# Patient Record
Sex: Male | Born: 1990 | Race: White | Hispanic: No | Marital: Single | Smoking: Never smoker
Health system: Southern US, Community
[De-identification: ages and names within clinical notes are randomized; demographics above are authoritative.]

## PROBLEM LIST (undated history)

## (undated) DIAGNOSIS — K5090 Crohn's disease, unspecified, without complications: Secondary | ICD-10-CM

## (undated) DIAGNOSIS — M8580 Other specified disorders of bone density and structure, unspecified site: Secondary | ICD-10-CM

## (undated) DIAGNOSIS — A0472 Enterocolitis due to Clostridium difficile, not specified as recurrent: Secondary | ICD-10-CM

## (undated) DIAGNOSIS — R569 Unspecified convulsions: Secondary | ICD-10-CM

## (undated) DIAGNOSIS — E559 Vitamin D deficiency, unspecified: Secondary | ICD-10-CM

## (undated) DIAGNOSIS — G809 Cerebral palsy, unspecified: Secondary | ICD-10-CM

## (undated) HISTORY — DX: Cerebral palsy, unspecified: G80.9

## (undated) HISTORY — DX: Crohn's disease, unspecified, without complications: K50.90

## (undated) HISTORY — DX: Enterocolitis due to Clostridium difficile, not specified as recurrent: A04.72

---
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
## 2009-02-18 ENCOUNTER — Ambulatory Visit: Payer: Self-pay | Admitting: Pediatrics

## 2009-06-19 ENCOUNTER — Ambulatory Visit: Payer: Self-pay | Admitting: Pediatrics

## 2009-10-21 ENCOUNTER — Ambulatory Visit: Payer: Self-pay | Admitting: Pediatrics

## 2010-02-04 ENCOUNTER — Ambulatory Visit: Payer: Self-pay | Admitting: Pediatrics

## 2010-06-10 ENCOUNTER — Ambulatory Visit (INDEPENDENT_AMBULATORY_CARE_PROVIDER_SITE_OTHER): Payer: Medicaid Other | Admitting: Pediatrics

## 2010-06-10 DIAGNOSIS — K5080 Crohn's disease of both small and large intestine without complications: Secondary | ICD-10-CM

## 2010-07-22 NOTE — Op Note (Signed)
NAMELEEMON, AYALA NO.:  [id number]   MEDICAL RECORD NO.:  [id number]          PATIENT TYPE:  AMB   LOCATION:  SDS                          FACILITY:  [facility]   PHYSICIAN:  Oletha Blend, M.D.  DATE OF BIRTH:  [birth year]   DATE OF PROCEDURE:  08/20/2006  DATE OF DISCHARGE:  08/20/2006                               OPERATIVE REPORT   PREOPERATIVE DIAGNOSIS:  Diarrhea and weight loss.   POSTOPERATIVE DIAGNOSIS:  Crohn's colitis.   NAME OF OPERATION:  Colonoscopy with biopsy.   SURGEON:  Oletha Blend, M.D.   ASSISTANTS:  None.   DESCRIPTION OF FINDINGS:  Following informed written consent, the  patient was taken to the operating room and placed under general  anesthesia, with continuous cardiopulmonary monitoring.  He remained in  the supine position, and the Pentax pediatric colonoscope was inserted  per rectum and advanced without difficulty.  There was no evidence for  perianal tags or fissures.  Digital examination of the rectum revealed  an empty rectal vault.  The colonoscope was advanced 140 cm to the  cecum.  The entire colon had diffuse serpiginous ulcers with  pseudopolyps.  Multiple biopsies were obtained in the cecum, hepatic  flexure, and sigmoid colon, which confirmed chronic colitis, most likely  Crohn's disease.  The colonoscope was gradually withdrawn, and the  patient was awakened and taken to the recovery room in satisfactory  condition.  He will be placed on prednisone therapy and released later  today to the care of his family.  A 18-monthreturn appointment will be  established by phone to monitor his progress.   DESCRIPTION OF TECHNICAL PROCEDURES USED:  Pentax pediatric colonoscope,  with cold biopsy forceps.   DESCRIPTION OF SPECIMENS REMOVED:  Cecum x3 in formalin, hepatic flexure  x3 in formalin, sigmoid colon x3 in formalin.           ______________________________  JOletha Blend M.D.     JHC/MEDQ  D:  08/26/2006   T:  08/27/2006  Job:  [id number]  cc:   KGregary Signs M.D.

## 2010-10-16 ENCOUNTER — Encounter: Payer: Self-pay | Admitting: Gastroenterology

## 2010-10-16 ENCOUNTER — Ambulatory Visit: Payer: Medicaid Other | Admitting: Gastroenterology

## 2010-10-16 ENCOUNTER — Ambulatory Visit (INDEPENDENT_AMBULATORY_CARE_PROVIDER_SITE_OTHER): Payer: Medicaid Other | Admitting: Gastroenterology

## 2010-10-16 VITALS — BP 114/75 | HR 75 | Temp 98.3°F | Ht 65.0 in | Wt 110.0 lb

## 2010-10-16 DIAGNOSIS — K5090 Crohn's disease, unspecified, without complications: Secondary | ICD-10-CM

## 2010-10-16 DIAGNOSIS — K5010 Crohn's disease of large intestine without complications: Secondary | ICD-10-CM | POA: Insufficient documentation

## 2010-10-16 MED ORDER — MERCAPTOPURINE 50 MG PO TABS: 50.0000 mg | ORAL_TABLET | Freq: Every day | ORAL | Status: DC

## 2010-10-16 NOTE — Assessment & Plan Note (Addendum)
Sx well controlled on 6-MP.  OPV IN 6 MOS. Will repeat CBC and HFP. Consider repeat Dexa in 2013. MAY LIBERALIZE DIET. AVOID NSAIDS AND NICOTINE.

## 2010-10-16 NOTE — Progress Notes (Signed)
Reminder in epic to follow up in 6 months °

## 2010-10-16 NOTE — Progress Notes (Signed)
Cc to PCP 

## 2010-10-16 NOTE — Progress Notes (Signed)
  Subjective:    Patient ID: Tanner Bradley, male    DOB: 1990-07-01, 20 y.o.   MRN: [id number]  HPI Last flare 2008. Sx controlled on 6-MP 50 mg daily. Bms: every day. No night stools. No bleeding, or abd pain. DIDN'T HAVE PAIN INTIALLY, just had diarrhea and anorexia. No joint or back pain, or sores in his mouth. Had PNA inJune.  Past Medical History  Diagnosis Date  . Crohn's disease 2008 TCS POS SBFT NL    Rx: Prednisone then 6-MP    Past Surgical History  Procedure Date  . Colonoscopy 2008    COLITIS, bX: POS GRANULOMA-->CD   Allergies  Allergen Reactions  . Amoxicillin Rash   Current Outpatient Prescriptions  Medication Sig Dispense Refill  . Calcium Carbonate-Vit D-Min (CALCIUM 600+D PLUS MINERALS) 600-400 MG-UNIT CHEW Chew by mouth.        . mercaptopurine (PURINETHOL) 50 MG tablet Take 1 tablet (50 mg total) by mouth daily. Give on an empty stomach 1 hour before or 2 hours after meals. Caution: Chemotherapy.  30 tablet  5    History   Social History  . Marital Status: Single   Social History Main Topics  . Smoking status: Never Smoker   .    Marland Kitchen Alcohol Use: No       Review of Systems  All other systems reviewed and are negative.       Objective:   Physical Exam  Vitals reviewed. Constitutional: He is oriented to person, place, and time. He appears well-developed.       THIN WHITE MALE  HENT:  Head: Normocephalic and atraumatic.  Mouth/Throat: No oropharyngeal exudate.       NO LESIONS  Eyes: Pupils are equal, round, and reactive to light. No scleral icterus.  Neck: Normal range of motion. Neck supple.  Cardiovascular: Normal rate, regular rhythm and normal heart sounds.   Pulmonary/Chest: Breath sounds normal.  Abdominal: Soft. Bowel sounds are normal. He exhibits no distension. There is no tenderness.  Lymphadenopathy:    He has no cervical adenopathy.  Neurological: He is alert and oriented to person, place, and time.       NO NEW FOCAL  DEFICITS  Psychiatric: He has a normal mood and affect.          Assessment & Plan:

## 2010-12-25 LAB — CBC
HCT: 36.7
Hemoglobin: 11.6 — ABNORMAL LOW
MCHC: 31.7
MCV: 77.1 — ABNORMAL LOW
Platelets: 803 — ABNORMAL HIGH
RBC: 4.76
RDW: 14.7 — ABNORMAL HIGH
WBC: 9

## 2011-02-09 ENCOUNTER — Telehealth: Payer: Self-pay | Admitting: Gastroenterology

## 2011-02-09 NOTE — Telephone Encounter (Signed)
Informed Mom.

## 2011-02-09 NOTE — Telephone Encounter (Signed)
Called and spoke to pt's mom, Smitty Knudsen. She said she just had pt's prescription of Mercaptopurine 21m once a day filled. This is first Rx from Dr. FOneida Alar and she had said to take on an empty stomach. She would like to know if pt can continue to take it with breakfast. Pediatric GI physician had let him do it, when he had some problems taking on an empty stomach. She just wants to made sure she gives as it should be done. Please advise!

## 2011-02-09 NOTE — Telephone Encounter (Signed)
Mother of patient has questions about a prescription. Please return her call at [phone] (patient is mentally impaired and she is his POA)

## 2011-02-09 NOTE — Telephone Encounter (Signed)
Call pt's mom. He may take 6MP with his breakfast.

## 2011-04-13 ENCOUNTER — Telehealth: Payer: Self-pay | Admitting: Gastroenterology

## 2011-04-13 NOTE — Telephone Encounter (Signed)
Spoke with mom. Pt having wisdom teeth extraction. Instructed pt's mom to avoid long term NSAIDs.

## 2011-04-13 NOTE — Telephone Encounter (Signed)
Please call patient's mother regarding questions about patient receiving anesthesia. She has concerns with his crohn's disease.

## 2011-04-29 ENCOUNTER — Ambulatory Visit: Payer: Medicaid Other | Admitting: Gastroenterology

## 2011-05-20 ENCOUNTER — Ambulatory Visit (INDEPENDENT_AMBULATORY_CARE_PROVIDER_SITE_OTHER): Payer: Medicaid Other | Admitting: Gastroenterology

## 2011-05-20 ENCOUNTER — Encounter: Payer: Self-pay | Admitting: Gastroenterology

## 2011-05-20 VITALS — BP 120/73 | HR 111 | Temp 97.6°F | Ht 62.0 in | Wt 100.0 lb

## 2011-05-20 DIAGNOSIS — T380X5A Adverse effect of glucocorticoids and synthetic analogues, initial encounter: Secondary | ICD-10-CM

## 2011-05-20 DIAGNOSIS — K5010 Crohn's disease of large intestine without complications: Secondary | ICD-10-CM

## 2011-05-20 DIAGNOSIS — M8580 Other specified disorders of bone density and structure, unspecified site: Secondary | ICD-10-CM

## 2011-05-20 DIAGNOSIS — M899 Disorder of bone, unspecified: Secondary | ICD-10-CM

## 2011-05-20 DIAGNOSIS — K5090 Crohn's disease, unspecified, without complications: Secondary | ICD-10-CM

## 2011-05-20 LAB — CBC WITH DIFFERENTIAL/PLATELET
Basophils Absolute: 0 10*3/uL (ref 0.0–0.1)
Basophils Relative: 1 % (ref 0–1)
Eosinophils Absolute: 0 10*3/uL (ref 0.0–0.7)
Eosinophils Relative: 1 % (ref 0–5)
HCT: 42.7 % (ref 39.0–52.0)
Hemoglobin: 14.2 g/dL (ref 13.0–17.0)
Lymphocytes Relative: 25 % (ref 12–46)
Lymphs Abs: 0.7 10*3/uL (ref 0.7–4.0)
MCH: 34.2 pg — ABNORMAL HIGH (ref 26.0–34.0)
MCHC: 33.3 g/dL (ref 30.0–36.0)
MCV: 102.9 fL — ABNORMAL HIGH (ref 78.0–100.0)
Monocytes Absolute: 0.3 10*3/uL (ref 0.1–1.0)
Monocytes Relative: 11 % (ref 3–12)
Neutro Abs: 1.8 10*3/uL (ref 1.7–7.7)
Neutrophils Relative %: 62 % (ref 43–77)
Platelets: 295 10*3/uL (ref 150–400)
RBC: 4.15 MIL/uL — ABNORMAL LOW (ref 4.22–5.81)
RDW: 13.6 % (ref 11.5–15.5)
WBC: 2.9 10*3/uL — ABNORMAL LOW (ref 4.0–10.5)

## 2011-05-20 LAB — COMPREHENSIVE METABOLIC PANEL
ALT: 15 U/L (ref 0–53)
AST: 16 U/L (ref 0–37)
Albumin: 5.1 g/dL (ref 3.5–5.2)
Alkaline Phosphatase: 44 U/L (ref 39–117)
BUN: 13 mg/dL (ref 6–23)
CO2: 28 mEq/L (ref 19–32)
Calcium: 9.9 mg/dL (ref 8.4–10.5)
Chloride: 103 mEq/L (ref 96–112)
Creat: 0.87 mg/dL (ref 0.50–1.35)
Glucose, Bld: 90 mg/dL (ref 70–99)
Potassium: 4.2 mEq/L (ref 3.5–5.3)
Sodium: 142 mEq/L (ref 135–145)
Total Bilirubin: 0.8 mg/dL (ref 0.3–1.2)
Total Protein: 7.7 g/dL (ref 6.0–8.3)

## 2011-05-20 MED ORDER — MERCAPTOPURINE 50 MG PO TABS: ORAL_TABLET | ORAL | Status: DC

## 2011-05-20 NOTE — Patient Instructions (Signed)
DRINK CARNATION INSTANT BREAKFAST TWICE DAILY.  CONTINUE 6-MP.  ADD HIGH CALORIE FOODS. BRUSH YOUR TEETH AFTERWARDS.  CHECK LABS TODAY. FOLLOW UP IN 6 MOS.

## 2011-05-20 NOTE — Progress Notes (Signed)
Faxed to PCP

## 2011-05-20 NOTE — Progress Notes (Signed)
  Subjective:    Patient ID: Tanner Bradley, male    DOB: 10-07-1990, 21 y.o.   MRN: [id number]  PCP: [facility]  HPI Has been having trouble with his teeth. Dentist recommended pt stop eating sweets. Diet has changed significantly. Pt lost 10 lbs since last visit. No NAUSEA, VOMITING, DIARRHEA, SORES IN HIS MOUTH, RASH ON LEGS, OR JOINT/BACK PAIN.  Past Medical History  Diagnosis Date  . Crohn's disease 2008 TCS POS SBFT NL    Rx: Prednisone then 6-MP  . Cerebral palsy     Past Surgical History  Procedure Date  . 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 Amoxicillin Rash    Current Outpatient Prescriptions  Medication Sig Dispense Refill  . mercaptopurine (PURINETHOL) 50 MG tablet 1 po daily-Give on an empty stomach 1 hour before or 2 hours after meals.    . Calcium Carbonate-Vit D-Min (CALCIUM 600+D PLUS MINERALS) 600-400 MG-UNIT CHEW Chew by mouth.            Review of Systems     Objective:   Physical Exam  Vitals reviewed. Constitutional: He is oriented to person, place, and time. No distress.  HENT:  Head: Normocephalic and atraumatic.  Mouth/Throat: Oropharynx is clear and moist. No oropharyngeal exudate.  Eyes: Pupils are equal, round, and reactive to light. No scleral icterus.  Neck: Normal range of motion. Neck supple.  Cardiovascular: Normal rate, regular rhythm and normal heart sounds.   Pulmonary/Chest: Effort normal and breath sounds normal. No respiratory distress.  Abdominal: Soft. Bowel sounds are normal. He exhibits no distension. There is no tenderness.  Musculoskeletal: Normal range of motion. He exhibits no edema.  Lymphadenopathy:    He has no cervical adenopathy.  Neurological: He is alert and oriented to person, place, and time.       NO FOCAL DEFICITS   Psychiatric: He has a normal mood and affect.          Assessment & Plan:

## 2011-05-20 NOTE — Assessment & Plan Note (Signed)
NEEDS REPEAT DEXA SCAN & ENDOCRINOLOGY REFERRAL.

## 2011-05-20 NOTE — Assessment & Plan Note (Addendum)
Sx controlled. Weight down associted with diet change due to dental issues.  Add high calorie foods. BRUSH TEETH AFTERWARDS. CIB BID Cmp/cbc/diff qyr OPV in 6 mos. NEEDS DEXA SCAN & REFERRAL TO ENDOCRINOLOGY-2010 LOW BMD.

## 2011-05-21 ENCOUNTER — Telehealth: Payer: Self-pay | Admitting: Gastroenterology

## 2011-05-21 NOTE — Telephone Encounter (Signed)
Tanner Bradley , can you review pt's labs in Dr. Nona Dell absence?  ( pt weighed 100 lbs  Yesterday).

## 2011-05-21 NOTE — Telephone Encounter (Signed)
Informed mom of weight. She is aware [name] will review labs early next week and make recommendations.

## 2011-05-21 NOTE — Progress Notes (Signed)
Reminder in epic to follow up in 6 months in E30 with [facility]

## 2011-05-21 NOTE — Telephone Encounter (Signed)
You can give her his weight from [place] let her know he was down 10# since last office visit.   Dr Oneida Alar will review his labs next week & make further recommendations.

## 2011-05-21 NOTE — Telephone Encounter (Signed)
Mother is calling about his weight, shes asking what his weight was yesterday & also wants lab results from yesterday please advise??

## 2011-05-26 ENCOUNTER — Telehealth: Payer: Self-pay | Admitting: Gastroenterology

## 2011-05-26 NOTE — Telephone Encounter (Signed)
CALLED CELL PHONE-WRONG #. [place] PHONE, RINGS, SOMEONE PICKS UP, BUT HANGS UP.  PT HAS NL CMP, MILD LEUKOPENIA (ANC 1800), AND MCV 102.9. NEEDS B12 LEVEL.  SEND LETTER FOR PT TO CONTACT OFFICE FOR RESULTS AND UPDATE PHONE NUMBERS.

## 2011-05-26 NOTE — Telephone Encounter (Signed)
CALLED PT'S HOME PHONE. NO VM.

## 2011-05-26 NOTE — Telephone Encounter (Addendum)
CALLED PT'S HOME PHONE. NO VM.

## 2011-05-27 ENCOUNTER — Other Ambulatory Visit: Payer: Self-pay

## 2011-05-27 ENCOUNTER — Other Ambulatory Visit: Payer: Self-pay | Admitting: Gastroenterology

## 2011-05-27 DIAGNOSIS — D72819 Decreased white blood cell count, unspecified: Secondary | ICD-10-CM

## 2011-05-27 NOTE — Telephone Encounter (Signed)
Called and informed pt's Mom. Lab order faxed to [facility].

## 2011-05-28 ENCOUNTER — Telehealth: Payer: Self-pay | Admitting: Gastroenterology

## 2011-05-28 LAB — VITAMIN B12: Vitamin B-12: 655 pg/mL (ref 211–911)

## 2011-05-28 NOTE — Telephone Encounter (Signed)
Please call pt's mother (POA). She is anxious to know the lab results.

## 2011-05-28 NOTE — Telephone Encounter (Signed)
SPOKE WITH MOM-HOME PHONE. DOESN'T WANT HIM TO LOSE MORE WEIGHT. HE'S RIGHT AT 100 LBS. PLAN TO REMOVE 3 WISDOM TEETH MAY 22. LOVE CIB. DISCUSSED LAB RESULTS. PT NEEDS FOLIC ACID DAILY. EATING MUCH MORE. DISCUSSED WITH MOM IF PT LOSES TO 95 LBS, MAY NEED A FEEDING TUBE.

## 2011-05-28 NOTE — Telephone Encounter (Signed)
Please call her. He should take 1 mg daily.

## 2011-05-28 NOTE — Telephone Encounter (Signed)
Informed mom.

## 2011-05-28 NOTE — Telephone Encounter (Signed)
Pt's mom called and wanted to know how much folic acid you wanted him to take daily

## 2011-09-16 ENCOUNTER — Ambulatory Visit (INDEPENDENT_AMBULATORY_CARE_PROVIDER_SITE_OTHER): Payer: Medicaid Other | Admitting: Gastroenterology

## 2011-09-16 ENCOUNTER — Encounter: Payer: Self-pay | Admitting: Gastroenterology

## 2011-09-16 VITALS — BP 128/80 | HR 124 | Temp 97.8°F | Ht 62.0 in | Wt 110.6 lb

## 2011-09-16 DIAGNOSIS — M8580 Other specified disorders of bone density and structure, unspecified site: Secondary | ICD-10-CM

## 2011-09-16 DIAGNOSIS — K5010 Crohn's disease of large intestine without complications: Secondary | ICD-10-CM

## 2011-09-16 DIAGNOSIS — M899 Disorder of bone, unspecified: Secondary | ICD-10-CM

## 2011-09-16 DIAGNOSIS — T380X5A Adverse effect of glucocorticoids and synthetic analogues, initial encounter: Secondary | ICD-10-CM

## 2011-09-16 MED ORDER — B-6 FOLIC ACID 400-1000-50 MCG-MCG-MG PO CAPS: ORAL_CAPSULE | ORAL | Status: DC

## 2011-09-16 NOTE — Progress Notes (Signed)
  Subjective:    Patient ID: Tanner Bradley, male    DOB: [birth year], 22 y.o.   MRN: [id number]  PCP: [facility]  HPI Gained weight since last visit. BMs fine. Eating better. Teeth fixed. 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 BUILDS ROBOTS AND COMPUTERS.   Past Medical History  Diagnosis Date  . Crohn's disease 2008 TCS POS SBFT NL    Rx: Prednisone then 6-MP  . Cerebral palsy     Past Surgical History  Procedure Date  . 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 Amoxicillin Rash    Current Outpatient Prescriptions  Medication Sig Dispense Refill  . calcium citrate (CALCITRATE - DOSED IN MG ELEMENTAL CALCIUM) 950 MG tablet Take 2 tablets by mouth daily.      . mercaptopurine (PURINETHOL) 50 MG tablet 1 po daily-Give on an empty stomach 1 hour before or 2 hours after meals.    Marland Kitchen FOLIC ACID 694/854 6270 MCG DAILY        Review of Systems     Objective:   Physical Exam  Vitals reviewed. Constitutional: He is oriented to person, place, and time. No distress.  HENT:  Head: Normocephalic and atraumatic.  Mouth/Throat: Oropharynx is clear and moist.  Eyes: Conjunctivae are normal. Pupils are equal, round, and reactive to light. No scleral icterus.  Neck: Normal range of motion. Neck supple.  Cardiovascular: Normal rate and regular rhythm.   Pulmonary/Chest: Effort normal and breath sounds normal. No respiratory distress.  Abdominal: Soft. Bowel sounds are normal. He exhibits no distension. There is no tenderness.  Musculoskeletal: He exhibits no edema.  Lymphadenopathy:    He has no cervical adenopathy.  Neurological: He is alert and oriented to person, place, and time.  Psychiatric: He has a normal mood and affect.          Assessment & Plan:

## 2011-09-16 NOTE — Assessment & Plan Note (Signed)
LAST DEXA 2010 AND TAKES CALCIUM. WEIGHT UP 10 LBS, BML > 20. OFF STEROIDS FOR > 1 YEAR.  DEXA SCAN AT [place]. SEE ENDOCRINOLOGY IF OSTEOPENIA PERSISTS

## 2011-09-16 NOTE — Patient Instructions (Signed)
COMPLETE THE DEXA SCAN AT [facility].  CONTINUE DIET PLAN.  YOUR PRESCRIPTION FOR FOLIC ACID WAS SENT TO [facility].  FOLLOW UP IN 6 MOS. WE WILL NEED TO RECHECK LABS AFTER YOUR NEXT VISIT.

## 2011-09-16 NOTE — Progress Notes (Signed)
Forwarded to [facility]

## 2011-09-16 NOTE — Assessment & Plan Note (Signed)
CONTINUE 6-MP.  OPV IN 6 MOS-RECHECK LABS.

## 2011-09-17 NOTE — Progress Notes (Signed)
Reminder in epic to follow up in 3 months with [facility] ONLY in E30

## 2011-09-21 NOTE — Progress Notes (Signed)
Faxed to PCP

## 2011-09-28 ENCOUNTER — Telehealth: Payer: Self-pay | Admitting: Gastroenterology

## 2011-09-28 NOTE — Telephone Encounter (Signed)
Mother is calling to get the bone density results if they are back. Please call Tanner Bradley (mother) at [phone]

## 2011-09-28 NOTE — Telephone Encounter (Signed)
[facility] to see if the results where back and they are not.

## 2011-09-30 NOTE — Telephone Encounter (Signed)
I received the bone density results from [facility] and gave to SLF. Pt's mother called back wanting to know if I had gotten them. I told her that I did and gave them to SLF . I also told her that as soon I SLF gave [place] the results that we would call her. She is just very worried about her son.

## 2011-09-30 NOTE — Telephone Encounter (Signed)
I called [facility] and they said that the test is done but they do not a hard copy to fax to me yet, but as soon as they get it they will fax it to me. Pt's mother is aware that we are waiting on them to fax it to [place].

## 2011-10-01 ENCOUNTER — Telehealth: Payer: Self-pay | Admitting: Gastroenterology

## 2011-10-01 NOTE — Telephone Encounter (Signed)
Pt's mother called this morning want to know if we had the results. I paged SLF to see if she could get me the results for them.

## 2011-10-01 NOTE — Telephone Encounter (Signed)
SLF said that she will call Pt's mother when she get a chance. I am going to call Pt's mother to make her aware.

## 2011-10-01 NOTE — Telephone Encounter (Signed)
PLEASE CALL PT.  HE STILL HAS OSTEOPENIA. HE SHOULD SEE ENDOCRINOLOGY (DR. NIDA).

## 2011-10-02 NOTE — Telephone Encounter (Signed)
Pt's mother is aware of results.

## 2011-10-02 NOTE — Telephone Encounter (Signed)
Referral has been faxed waiting to get appointment date an time

## 2011-10-06 NOTE — Telephone Encounter (Signed)
Patient is scheduled to see Dr. Dorris Fetch on August 1st at 10 am and patient is aware

## 2011-10-07 NOTE — Telephone Encounter (Signed)
REVIEWED.  WILL FAX REPORT FROM [facility] TODAY.

## 2012-01-08 DIAGNOSIS — A0472 Enterocolitis due to Clostridium difficile, not specified as recurrent: Secondary | ICD-10-CM

## 2012-01-08 HISTORY — DX: Enterocolitis due to Clostridium difficile, not specified as recurrent: A04.72

## 2012-01-14 ENCOUNTER — Other Ambulatory Visit: Payer: Self-pay | Admitting: Gastroenterology

## 2012-01-14 ENCOUNTER — Ambulatory Visit (INDEPENDENT_AMBULATORY_CARE_PROVIDER_SITE_OTHER): Payer: Medicaid Other | Admitting: Gastroenterology

## 2012-01-14 ENCOUNTER — Telehealth: Payer: Self-pay

## 2012-01-14 ENCOUNTER — Encounter: Payer: Self-pay | Admitting: Gastroenterology

## 2012-01-14 VITALS — BP 125/79 | HR 156 | Temp 98.5°F | Ht 62.0 in | Wt 112.6 lb

## 2012-01-14 DIAGNOSIS — R197 Diarrhea, unspecified: Secondary | ICD-10-CM

## 2012-01-14 DIAGNOSIS — K5010 Crohn's disease of large intestine without complications: Secondary | ICD-10-CM

## 2012-01-14 LAB — CBC WITH DIFFERENTIAL/PLATELET
Basophils Absolute: 0.1 10*3/uL (ref 0.0–0.1)
Basophils Relative: 1 % (ref 0–1)
Eosinophils Absolute: 0 10*3/uL (ref 0.0–0.7)
Eosinophils Relative: 0 % (ref 0–5)
HCT: 40.1 % (ref 39.0–52.0)
Hemoglobin: 13.8 g/dL (ref 13.0–17.0)
Lymphocytes Relative: 14 % (ref 12–46)
Lymphs Abs: 1 10*3/uL (ref 0.7–4.0)
MCH: 33.2 pg (ref 26.0–34.0)
MCHC: 34.4 g/dL (ref 30.0–36.0)
MCV: 96.4 fL (ref 78.0–100.0)
Monocytes Absolute: 1 10*3/uL (ref 0.1–1.0)
Monocytes Relative: 15 % — ABNORMAL HIGH (ref 3–12)
Neutro Abs: 4.8 10*3/uL (ref 1.7–7.7)
Neutrophils Relative %: 70 % (ref 43–77)
Platelets: 447 10*3/uL — ABNORMAL HIGH (ref 150–400)
RBC: 4.16 MIL/uL — ABNORMAL LOW (ref 4.22–5.81)
RDW: 18.2 % — ABNORMAL HIGH (ref 11.5–15.5)
WBC: 6.8 10*3/uL (ref 4.0–10.5)

## 2012-01-14 NOTE — Telephone Encounter (Signed)
SLF asked for me to call them and see if they could come into the office today at 2:00. They are coming today.

## 2012-01-14 NOTE — Telephone Encounter (Signed)
OPV TODAY

## 2012-01-14 NOTE — Progress Notes (Signed)
Faxed to PCP

## 2012-01-14 NOTE — Patient Instructions (Signed)
GET BLOOD DRAWN. I WILL CHECK YOUR BLOOD COUNT AND TO MAKE SURE YOUR MERCAPTOPURINE DOSE IS THE RIGHT DOSE.  COLLECT STOOL SAMPLE.  I WILL CALL YOU WITH THE RESULTS.  YOU MAY USE KAOPECTATE AS NEEDED FOR LOOSE STOOLS.  FOLLOW UP JAN 2014.

## 2012-01-14 NOTE — Telephone Encounter (Signed)
Pt's mother called and said the Pt had some blood in his stool this morning. She thinks he is having a flare up. She would like for you to please call her at  [phone].

## 2012-01-14 NOTE — Assessment & Plan Note (Addendum)
ANOREXIA/DIARRHEA/BRBPR C/W WITH SX MILDLY ACTIVE DISEASE. RECENT ABX AND SICK FAMILY MEMBERS. DIFFERENTIAL DIAGNOSIS INCLUDE INFECTIOUS COLITIS.  NEEDS STOOL FOR ROUTINE Cx AND CDIFF. IF NO INFECTION, LOW DOSE STEROID TAPER. CBC-TP METABOLITIES TODAY. OPV IN JAN 2014.

## 2012-01-14 NOTE — Addendum Note (Signed)
Addended by: Danie Binder on: 01/14/2012 02:31 PM   Modules accepted: Orders

## 2012-01-14 NOTE — Progress Notes (Signed)
  Subjective:    Patient ID: Tanner Bradley, male    DOB: [birth year], 21 y.o.   MRN: [id number]  PCP: [facility]  HPI HAD COLDS 2 WEEKS AGO. GOT ABX. NOW HAS WATERY STOOLS AND IT TURNS HARD. SX FOR 2-3 DAYS. SAW SML AMOUNT OF BLOOD. DECREASED ABX ABOUT A MO AGO. LOSS OF APPETITE FOR 2 WEEKS. LAST TCS 2008. NO NAUSEA OR VOMITING. 2 STOOLS YESTERDAY: I AM HARD, 2ND TIMES IT WATERY. TODAY: SAW BLOOD AND LOOSE (LUMPS OF POOPY). MAY HAVE LOST 2 LBS IN PAST WEEK.  Past Medical History  Diagnosis Date  . Crohn's disease 2008 TCS POS SBFT NL    Rx: Prednisone then 6-MP  . Cerebral palsy    Past Surgical History  Procedure Date  . 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 Amoxicillin Rash    Current Outpatient Prescriptions  Medication Sig Dispense Refill  . calcium citrate (CALCITRATE - DOSED IN MG ELEMENTAL CALCIUM) 950 MG tablet Take 2 tablets by mouth daily.      . Cobalamine Combinations (B-6 FOLIC ACID) 258-5277-82 MCG-MCG-MG CAPS 1 PO DAILY    . mercaptopurine (PURINETHOL) 50 MG tablet 1 po daily-Give on an empty stomach 1 hour before or 2 hours after meals.        Review of Systems     Objective:   Physical Exam  Vitals reviewed. Constitutional: He is oriented to person, place, and time. No distress.  HENT:  Head: Normocephalic and atraumatic.  Mouth/Throat: Oropharynx is clear and moist. No oropharyngeal exudate.  Eyes: Pupils are equal, round, and reactive to light. No scleral icterus.  Neck: Normal range of motion. Neck supple.  Cardiovascular: Normal rate, regular rhythm and normal heart sounds.   Pulmonary/Chest: Effort normal and breath sounds normal. No respiratory distress.  Abdominal: Soft. Bowel sounds are normal. He exhibits no distension. There is no tenderness.  Musculoskeletal: He exhibits no edema.  Lymphadenopathy:    He has no cervical adenopathy.  Neurological: He is alert and oriented to person, place, and  time.       NO  NEW FOCAL DEFICITS   Psychiatric:       SLIGHTLY ANXIOUS MOOD, NL AFFECT           Assessment & Plan:

## 2012-01-15 ENCOUNTER — Telehealth: Payer: Self-pay | Admitting: *Deleted

## 2012-01-15 LAB — PROMETHEUS-MAIL

## 2012-01-15 NOTE — Progress Notes (Addendum)
CALLED PT'S MOTHER. HIS BLOOD COUNT IS ESSENTIALLY UNCHANGED SINCE MAR 2013. IT WAS 14.2 AND YESTERDAY IT WAS 13.8. I WILL CALL HER WHEN THE STOOL STUDY RESULTS ARE KNOWN.  STOOL SOFT. EATING BETTER TODAY. FEELING BETTER TODAY.  MAY USE KAOPECTATE 3-4 TIMES A DAY AS NEEDED FOR DIARRHEA. WAIT FOR STOOL STUDIES.  IF INFECTION, ABX. IF NO INFECTION, LOW DOSE PREDNISONE TAPER.

## 2012-01-15 NOTE — Telephone Encounter (Signed)
Mr Langstaff's mother called today. She was able to get all his lab work in this am and would like a call back with the results when they are in. Thanks.

## 2012-01-18 ENCOUNTER — Telehealth: Payer: Self-pay | Admitting: Gastroenterology

## 2012-01-18 ENCOUNTER — Telehealth: Payer: Self-pay

## 2012-01-18 LAB — CLOSTRIDIUM DIFFICILE BY PCR: Toxigenic C. Difficile by PCR: DETECTED — CR

## 2012-01-18 MED ORDER — METRONIDAZOLE 500 MG PO TABS: ORAL_TABLET | ORAL | Status: DC

## 2012-01-18 NOTE — Telephone Encounter (Signed)
Routing to [name].

## 2012-01-18 NOTE — Telephone Encounter (Signed)
Mother is aware of new Rx for Flagyl and she will go pick it up and start it as soon as possible.

## 2012-01-18 NOTE — Telephone Encounter (Signed)
REVIEWED.  

## 2012-01-18 NOTE — Telephone Encounter (Signed)
T/C from [place] at [facility] reports pt has a positvie C-Diff. Sending this message to [name] and also a pager message.

## 2012-01-18 NOTE — Telephone Encounter (Signed)
Call [facility] AND ASK THEM WHEN THE STOOL STUDIES WILL BE BACK, THEN CALL HER AND LET HER KNOW WE WILL CALL HER AFTER THE RESULTS COME BACK.

## 2012-01-18 NOTE — Addendum Note (Signed)
Addended by: Danie Binder on: 01/18/2012 12:42 PM   Modules accepted: Orders

## 2012-01-18 NOTE — Telephone Encounter (Signed)
[name] sent Tx of Flagyl to the pharmacy. Ginger informed the mom.

## 2012-01-18 NOTE — Progress Notes (Addendum)
PLEASE CALL PT's mother. He has C Diff colitis. THIS IS CAUSING HIS DIARRHEA. HE SHOULD TAKE FLAGYL 500 MG TID FOR 10 DAYS.  HE SHOULD SEE HIS DIARRHEA GET BETTER IN 3 DAYS. HIS RX WAS SENT TO [facility].

## 2012-01-18 NOTE — Progress Notes (Signed)
Ginger informed mom of the Positive C-Diff and the Flagyl.

## 2012-01-18 NOTE — Telephone Encounter (Signed)
Levada Dy at [facility] is checking out and will call me back.

## 2012-01-18 NOTE — Telephone Encounter (Signed)
Per Ginger, she informed mom of the positive C-Diff and she is going to pick up the Flagyl.

## 2012-01-18 NOTE — Telephone Encounter (Signed)
Pt's mom is aware that we are checking this out and will let her know when we get the results.

## 2012-01-18 NOTE — Telephone Encounter (Signed)
Pt's mother called this morning to see if the results from stool are back. She is anxious to know what the next step in his plan of care is. Please call her back at [phone]

## 2012-01-18 NOTE — Progress Notes (Signed)
Informed mom of blood count. She is aware that we will call when we get the stool results.

## 2012-01-18 NOTE — Telephone Encounter (Signed)
Informed pt's mom that we are waiting for a call from the lab, but the [facility] will take about a week anyway.

## 2012-01-19 LAB — STOOL CULTURE

## 2012-01-21 ENCOUNTER — Telehealth: Payer: Self-pay

## 2012-01-21 NOTE — Telephone Encounter (Signed)
REVIEWED.  

## 2012-01-21 NOTE — Telephone Encounter (Signed)
Pt's mother called this morning to let us know he is feeling better but he still has diarrhea. She just wants to make sure everything is still ok and also the other labs are they back. Please advise [phone]

## 2012-01-21 NOTE — Progress Notes (Signed)
Pt is aware of OV with [facility] on 1/16 at 0830 in E30

## 2012-01-21 NOTE — Telephone Encounter (Signed)
Called and informed mom. She said that his diarrhea is already better and he is not having abdominal pain and he is eating a little better.

## 2012-01-21 NOTE — Telephone Encounter (Signed)
PLEASE CALL PT's mother. His diarrhea should be better by Sun. His labs show his 6-MP is at the right dose, not too much, and not too little.

## 2012-01-25 ENCOUNTER — Telehealth: Payer: Self-pay

## 2012-01-25 NOTE — Telephone Encounter (Signed)
PLEASE CALL PT'S MOTHER. EXPLAIN THAT HIS DIARRHEA SHOULD BE GETTING BETTER OVER THE NEXT 3-5 DAYS. AS LONG AS HIS APPETITE IS IMPROVING, SHE SHOULD NOT WORRY. SHE SHOULD TRY CARNATION INSTANT BREAKFAST WITH SOY MILK TO SUPPLEMENT HIS DIET. HE SHOULD DRINK THREE A DAY UNTIL THE DIARRHEA COMPLETELY RESOLVED.

## 2012-01-25 NOTE — Telephone Encounter (Signed)
Pt's mom called and said she is still a little worried about pt. He is still having some diarrhea every day ( 3-4 x in last 24 hours. He is not eating as good as she would like, she is afraid that he will lose the weight that he had worked so hard to put on. He had some chinese sweet and sour chicken and rice yesterday, a small serving. He had a couple of crackers with meds, a small bowl of fruity pebbles and 1/2 of small salad from [facility] and 1/2 baked potato. I told her I would let [name] know her concern.

## 2012-01-25 NOTE — Telephone Encounter (Signed)
Called and informed pt.  

## 2012-01-26 ENCOUNTER — Telehealth: Payer: Self-pay

## 2012-01-26 NOTE — Telephone Encounter (Signed)
PLEASE CALL mom. She should continue kaopectate. It sound like the diarrhea is getting better. She should not stop the diarrhea. It has infection in it and it needs to come out.

## 2012-01-26 NOTE — Telephone Encounter (Signed)
Called and informed pt's mom.

## 2012-01-26 NOTE — Telephone Encounter (Signed)
Pt's mom, Loletha Carrow, said that pt is doing the soy milk/ carnation drink. He is eating much better also. However, she states she is still concerned about his diarrhea. He had two loose stools yesterday and one today, yellow watery substance. She said should she continue to give Kaopectate, he is on his third bottle and said it is not doing any good. Should she just quit. ( She asked for something that wold make him stop diarrhea completely). Please advise!

## 2012-02-08 ENCOUNTER — Telehealth: Payer: Self-pay

## 2012-02-08 NOTE — Telephone Encounter (Signed)
Called and informed pt's mom.

## 2012-02-08 NOTE — Telephone Encounter (Signed)
Pt's mom, Tanner Bradley, called. Tanner Bradley pt has been eating a lot better. Doing his shakes too. His stools have been a lot better and she stopped giving Kaopectate last week. This AM he had one loose stool and she gave a dose of Kaopectate. Then he had a stool with a little hard turd. She is very anxious and wants to know what she can do different. ( she also said he has lost 7 pounds despite eating better. Please advise!

## 2012-02-08 NOTE — Telephone Encounter (Signed)
PLEASE CALL PT's mother.  SHE SHOULD KEEP DOING WHAT SHE IS DOING. HE WILL GAIN HIS WEIGHT BACK. HE SHOULD DRINK THREE SHAKES A DAY PLUS EAT REGULAR MEALS. HIS STOOL WILL VARY IN CONSISTENCY FOR THE NEXT MONTH. SHE SHOULD GET A PHILLIP'S COLON HEALTH, WHICH IS A PROBIOTIC AND HE SHOULD TAKE IT DAILY FOR THE NEXT 6 MOS.

## 2012-02-15 ENCOUNTER — Encounter: Payer: Self-pay | Admitting: Gastroenterology

## 2012-02-16 ENCOUNTER — Telehealth: Payer: Self-pay

## 2012-02-16 NOTE — Telephone Encounter (Signed)
Pt's mom, Vickie, called and said that pt is doing really good now. She said he is doing very well, no diarrhea and his appetite has come back about 110 %. She would just like to know when [name] would recommend that he can go back to his 2% milk ( he doesn't do the whole milk). She said she is not in a hurry,  And she is aware that [name] is out of town this week and she will except a call early next week.

## 2012-02-19 NOTE — Telephone Encounter (Signed)
PLEASE CALL PT's mother. HE MAY RESUME DAIRY PRODUCTS AFTER Mar 15 2012.

## 2012-02-22 NOTE — Telephone Encounter (Signed)
Called and informed pt's mom.

## 2012-03-24 ENCOUNTER — Encounter: Payer: Self-pay | Admitting: Gastroenterology

## 2012-03-24 ENCOUNTER — Ambulatory Visit (INDEPENDENT_AMBULATORY_CARE_PROVIDER_SITE_OTHER): Payer: Medicaid Other | Admitting: Gastroenterology

## 2012-03-24 VITALS — BP 118/80 | HR 133 | Temp 97.4°F | Ht 62.0 in | Wt 101.8 lb

## 2012-03-24 DIAGNOSIS — A0472 Enterocolitis due to Clostridium difficile, not specified as recurrent: Secondary | ICD-10-CM | POA: Insufficient documentation

## 2012-03-24 DIAGNOSIS — M8580 Other specified disorders of bone density and structure, unspecified site: Secondary | ICD-10-CM

## 2012-03-24 DIAGNOSIS — T380X5A Adverse effect of glucocorticoids and synthetic analogues, initial encounter: Secondary | ICD-10-CM

## 2012-03-24 DIAGNOSIS — K5010 Crohn's disease of large intestine without complications: Secondary | ICD-10-CM

## 2012-03-24 DIAGNOSIS — M899 Disorder of bone, unspecified: Secondary | ICD-10-CM

## 2012-03-24 LAB — COMPREHENSIVE METABOLIC PANEL
ALT: 9 U/L (ref 0–53)
AST: 13 U/L (ref 0–37)
Albumin: 4.1 g/dL (ref 3.5–5.2)
Alkaline Phosphatase: 59 U/L (ref 39–117)
BUN: 13 mg/dL (ref 6–23)
CO2: 29 mEq/L (ref 19–32)
Calcium: 9.5 mg/dL (ref 8.4–10.5)
Chloride: 102 mEq/L (ref 96–112)
Creat: 0.58 mg/dL (ref 0.50–1.35)
Glucose, Bld: 87 mg/dL (ref 70–99)
Potassium: 4.4 mEq/L (ref 3.5–5.3)
Sodium: 140 mEq/L (ref 135–145)
Total Bilirubin: 0.4 mg/dL (ref 0.3–1.2)
Total Protein: 7.3 g/dL (ref 6.0–8.3)

## 2012-03-24 LAB — CBC WITH DIFFERENTIAL/PLATELET
Basophils Absolute: 0 10*3/uL (ref 0.0–0.1)
Basophils Relative: 1 % (ref 0–1)
Eosinophils Absolute: 0.1 10*3/uL (ref 0.0–0.7)
Eosinophils Relative: 3 % (ref 0–5)
HCT: 38.3 % — ABNORMAL LOW (ref 39.0–52.0)
Hemoglobin: 12.9 g/dL — ABNORMAL LOW (ref 13.0–17.0)
Lymphocytes Relative: 25 % (ref 12–46)
Lymphs Abs: 0.9 10*3/uL (ref 0.7–4.0)
MCH: 33.9 pg (ref 26.0–34.0)
MCHC: 33.7 g/dL (ref 30.0–36.0)
MCV: 100.8 fL — ABNORMAL HIGH (ref 78.0–100.0)
Monocytes Absolute: 0.3 10*3/uL (ref 0.1–1.0)
Monocytes Relative: 8 % (ref 3–12)
Neutro Abs: 2.4 10*3/uL (ref 1.7–7.7)
Neutrophils Relative %: 63 % (ref 43–77)
Platelets: 432 10*3/uL — ABNORMAL HIGH (ref 150–400)
RBC: 3.8 MIL/uL — ABNORMAL LOW (ref 4.22–5.81)
RDW: 18.4 % — ABNORMAL HIGH (ref 11.5–15.5)
WBC: 3.8 10*3/uL — ABNORMAL LOW (ref 4.0–10.5)

## 2012-03-24 MED ORDER — MERCAPTOPURINE 50 MG PO TABS: ORAL_TABLET | ORAL | Status: DC

## 2012-03-24 NOTE — Patient Instructions (Signed)
CONTINUE PROBIOTIC AND VITAMINS.  CONTINUE MERCAPTOPURINE.  CALL ME IF ANYONE STARTS HIM ON ANTIBIOTICS.  GET YOUR LABS DRAWN.  FOLLOW UP IN 6 MOS.

## 2012-03-24 NOTE — Assessment & Plan Note (Signed)
NEXT VISIT IN AUG 2014

## 2012-03-24 NOTE — Progress Notes (Signed)
  Subjective:    Patient ID: Tanner Bradley, male    DOB: June 24, 1990, 22 y.o.   MRN: [id number]  PCP: HOWARD  HPI PT LOST 12 LBS SINCE NOV. HAD A BOUT OF C DIFF. EATING BETTER. DRINKINING MILKSHAKES. SCALES AT HOME: 100 LBS. DID DROP DOWN IN 90s. BMs: NL TAKING PROBIOTICS. ENERGY LEVEL: NL. EATING LIKE CRAZY NOW. BACK ON REGULAR MILK NO CHANGE IN BM.   Past Medical History  Diagnosis Date  . Crohn's disease 2008 TCS POS SBFT NL    Rx: Prednisone then 6-MP  . Cerebral palsy   . C. difficile colitis NOV 2013   Past Surgical History  Procedure Date  . 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 Amoxicillin Rash    Current Outpatient Prescriptions  Medication Sig Dispense Refill  . calcium citrate (CALCITRATE - DOSED IN MG ELEMENTAL CALCIUM) 950 MG tablet Take 2 tablets by mouth daily.      . Cobalamine Combinations (B-6 FOLIC ACID) [id number] MCG-MCG-MG CAPS 1 PO DAILY    . mercaptopurine (PURINETHOL) 50 MG tablet 1 po daily-Give on an empty stomach 1 hour before or 2 hours after meals.    . Probiotic Product (PROBIOTIC PO) Take by mouth daily.    .          Review of Systems     Objective:   Physical Exam  Vitals reviewed. Constitutional: He is oriented to person, place, and time. No distress.  HENT:  Head: Normocephalic and atraumatic.  Mouth/Throat: Oropharynx is clear and moist. No oropharyngeal exudate.  Eyes: Pupils are equal, round, and reactive to light. No scleral icterus.  Cardiovascular: Normal rate, regular rhythm and normal heart sounds.   Pulmonary/Chest: Effort normal and breath sounds normal. No respiratory distress.  Abdominal: Soft. Bowel sounds are normal. He exhibits no distension. There is no tenderness.  Neurological: He is alert and oriented to person, place, and time.       NO  NEW FOCAL DEFICITS   Psychiatric: He has a normal mood and affect.          Assessment & Plan:

## 2012-03-24 NOTE — Assessment & Plan Note (Signed)
NOV 2013-SX RESOLVED  CALL IF A PROVIDER START ABX. WILL NEED FLAGYL THRU ABX COURSE PLUS 10 DAYS. CONTINUE PROBIOTIC.

## 2012-03-24 NOTE — Progress Notes (Signed)
Faxed to PCP

## 2012-03-24 NOTE — Assessment & Plan Note (Addendum)
SX CONTROLLED.  CONTINUE MERCAPTOPURINE CMP/CBC/DIFF TODAY & IN AUG 2014 OPV IN 6 MOS

## 2012-03-25 NOTE — Progress Notes (Signed)
PLEASE CALL PT's mother. HIS LABS LOOK GOOD. HIS HB IS 12.9 WHICH IS A LITTLE LOWER THAN IT WAS 2 MOS AGO(13.9). THAT IS TO BE EXPECTED DUE TO HIS RECENT ILLNESS.

## 2012-03-25 NOTE — Progress Notes (Signed)
Called and informed pt's mom.

## 2012-03-30 NOTE — Progress Notes (Signed)
Reminder in epic to follow up in 6 months with SF in E30 and labs are due

## 2012-04-07 ENCOUNTER — Telehealth: Payer: Self-pay

## 2012-04-07 NOTE — Telephone Encounter (Signed)
Pt's mom called and said that he is doing fine since OV. Eating well and no loose stool. She just wanted to [facility] [name] on his BM yesterday. He had a VERY LARGE STOOl and he called her to come and see and it had just a tiny bit of blood on the outside of the stool, she feels probably because it was so large. Otherwise, it was ok. Please advise!

## 2012-04-07 NOTE — Telephone Encounter (Signed)
Called and informed mom

## 2012-04-07 NOTE — Telephone Encounter (Signed)
PLEASE CALL PT's mother.  SHE IS RIGHT. THE BLOOD PROBABLY CAME FROM HIM HAVING A LARGE STOOL. KEEP AN EYE ON HIS STOOLS IF THE BLOOD CONTINUES HE WILL NEED A COLONOSCOPY.

## 2012-08-12 ENCOUNTER — Other Ambulatory Visit: Payer: Self-pay | Admitting: Gastroenterology

## 2012-08-16 ENCOUNTER — Encounter: Payer: Self-pay | Admitting: General Practice

## 2012-08-30 ENCOUNTER — Other Ambulatory Visit: Payer: Self-pay

## 2012-08-30 DIAGNOSIS — K50111 Crohn's disease of large intestine with rectal bleeding: Secondary | ICD-10-CM

## 2012-09-21 LAB — CBC WITH DIFFERENTIAL/PLATELET
Basophils Absolute: 0 10*3/uL (ref 0.0–0.1)
Basophils Relative: 1 % (ref 0–1)
Eosinophils Absolute: 0.1 10*3/uL (ref 0.0–0.7)
Eosinophils Relative: 3 % (ref 0–5)
HCT: 39.1 % (ref 39.0–52.0)
Hemoglobin: 13.1 g/dL (ref 13.0–17.0)
Lymphocytes Relative: 28 % (ref 12–46)
Lymphs Abs: 1.2 10*3/uL (ref 0.7–4.0)
MCH: 31.9 pg (ref 26.0–34.0)
MCHC: 33.5 g/dL (ref 30.0–36.0)
MCV: 95.1 fL (ref 78.0–100.0)
Monocytes Absolute: 0.6 10*3/uL (ref 0.1–1.0)
Monocytes Relative: 14 % — ABNORMAL HIGH (ref 3–12)
Neutro Abs: 2.3 10*3/uL (ref 1.7–7.7)
Neutrophils Relative %: 54 % (ref 43–77)
Platelets: 531 10*3/uL — ABNORMAL HIGH (ref 150–400)
RBC: 4.11 MIL/uL — ABNORMAL LOW (ref 4.22–5.81)
RDW: 16.4 % — ABNORMAL HIGH (ref 11.5–15.5)
WBC: 4.2 10*3/uL (ref 4.0–10.5)

## 2012-09-27 ENCOUNTER — Telehealth: Payer: Self-pay | Admitting: Gastroenterology

## 2012-09-27 NOTE — Telephone Encounter (Signed)
PLEASE CALL PT's mother. HIS BLOOD COUNT IS 13.1. IT IS SLIGHTLY BETTER THAN IT WAS IN JAN 2014.

## 2012-09-27 NOTE — Telephone Encounter (Signed)
Called and informed pt's mom.

## 2012-09-27 NOTE — Telephone Encounter (Signed)
Cc PCP 

## 2012-09-28 ENCOUNTER — Telehealth: Payer: Self-pay

## 2012-09-28 NOTE — Telephone Encounter (Signed)
Pt's mother came by the office to speak with SLF about his K+. I told her that SLF was at lunch and would not be back until 2:00. She would like to come back and talk with SLF about her son's K+. Until there is something we can tell her over the phone. Please advise

## 2012-09-28 NOTE — Telephone Encounter (Signed)
SAW PT. SPOKE WITH MOM. REDUCE CIB TO 2 DAILY & CUTOUT BANANAS. CALL WITH QUESTIONS OR CONCERNS.

## 2012-10-12 ENCOUNTER — Ambulatory Visit: Payer: Medicaid Other | Admitting: Gastroenterology

## 2012-10-13 ENCOUNTER — Ambulatory Visit (INDEPENDENT_AMBULATORY_CARE_PROVIDER_SITE_OTHER): Payer: Medicaid Other | Admitting: Gastroenterology

## 2012-10-13 ENCOUNTER — Encounter: Payer: Self-pay | Admitting: Gastroenterology

## 2012-10-13 VITALS — BP 124/82 | HR 119 | Temp 97.4°F | Ht 62.0 in | Wt 101.6 lb

## 2012-10-13 DIAGNOSIS — M949 Disorder of cartilage, unspecified: Secondary | ICD-10-CM

## 2012-10-13 DIAGNOSIS — A0472 Enterocolitis due to Clostridium difficile, not specified as recurrent: Secondary | ICD-10-CM

## 2012-10-13 DIAGNOSIS — M8580 Other specified disorders of bone density and structure, unspecified site: Secondary | ICD-10-CM

## 2012-10-13 DIAGNOSIS — T380X5A Adverse effect of glucocorticoids and synthetic analogues, initial encounter: Secondary | ICD-10-CM

## 2012-10-13 DIAGNOSIS — M899 Disorder of bone, unspecified: Secondary | ICD-10-CM

## 2012-10-13 DIAGNOSIS — K5010 Crohn's disease of large intestine without complications: Secondary | ICD-10-CM

## 2012-10-13 NOTE — Progress Notes (Signed)
  Subjective:    Patient ID: Tanner Bradley, male    DOB: 1990/07/24, 22 y.o.   MRN: [id number]  [name], MD  HPI Eating better WITH INCENTIVES. HB UP SINCE NOV 2013. WEIGHT IS UP. DOING 2 MILK SHAKES AND STAYING AWAY FROM BANANAS. EATING PEACHES AND PLUMS. BMs: GOOD. NOTHING'S DIFFERENT. ONCE HAD BIG STOOL WITH A LITTLE BLOOD. APPETITE: GOOD.  Past Medical History  Diagnosis Date  . Crohn's disease 2008 TCS POS SBFT NL    Rx: Prednisone then 6-MP  . Cerebral palsy   . C. difficile colitis NOV 2013   Past Surgical History  Procedure Laterality Date  . 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 Amoxicillin Rash   Current Outpatient Prescriptions  Medication Sig Dispense Refill  . calcium citrate (CALCITRATE - DOSED IN MG ELEMENTAL CALCIUM) 950 MG tablet Take 2 tablets by mouth daily.      . folic acid (FOLVITE) 1 MG tablet TAKE 1 TABLET EVERY DAY    . mercaptopurine (PURINETHOL) 50 MG tablet 1 po daily-Give on an empty stomach 1 hour before or 2 hours after meals.    . Probiotic Product (PROBIOTIC PO) Take by mouth daily.           Review of Systems     Objective:   Physical Exam  Vitals reviewed. Constitutional: He is oriented to person, place, and time. He appears well-nourished. No distress.  HENT:  Head: Normocephalic and atraumatic.  Mouth/Throat: No oropharyngeal exudate.  Eyes: Pupils are equal, round, and reactive to light. No scleral icterus.  Neck: Normal range of motion. Neck supple.  Cardiovascular: Normal rate, regular rhythm and normal heart sounds.   Pulmonary/Chest: Effort normal and breath sounds normal. No respiratory distress.  Abdominal: Soft. Bowel sounds are normal. He exhibits no distension.  Musculoskeletal: He exhibits no edema.  Neurological: He is alert and oriented to person, place, and time.  NO  NEW FOCAL DEFICITS   Psychiatric: He has a normal mood and affect.           Assessment & Plan:

## 2012-10-13 NOTE — Assessment & Plan Note (Signed)
SX CONTROLLED.  CONTINUE IMURAN. 2 SHAKES A DAY OPV IN 6 MOS

## 2012-10-13 NOTE — Patient Instructions (Signed)
CONTINUE EATING A FULL PLATE. YOU ARE DOING A GOOD JOB!  DRINK 2 SHAKES A DAY.  CONTINUE IMURAN.  GOOGLE BRISTOL STOOL CHART (IMAGES).  FOLLOW UP IN 6 MOS. MERRY CHRISTMAS AND HAPPY NEW YEAR!

## 2012-10-13 NOTE — Assessment & Plan Note (Signed)
SX RESOLVED. ONE DAILY STOOL.

## 2012-10-13 NOTE — Assessment & Plan Note (Signed)
FOLLOWED BY DR. NIDA.

## 2012-10-14 NOTE — Progress Notes (Signed)
Reminder in epic °

## 2012-10-17 NOTE — Progress Notes (Signed)
CC'd to PCP 

## 2013-01-08 ENCOUNTER — Emergency Department (HOSPITAL_COMMUNITY)
Admission: EM | Admit: 2013-01-08 | Discharge: 2013-01-08 | Disposition: A | Discharge status: Home / Self Care | Payer: Medicaid Other | Attending: Emergency Medicine | Admitting: Emergency Medicine

## 2013-01-08 ENCOUNTER — Encounter (HOSPITAL_COMMUNITY): Payer: Self-pay | Admitting: Emergency Medicine

## 2013-01-08 DIAGNOSIS — Z79899 Other long term (current) drug therapy: Secondary | ICD-10-CM | POA: Insufficient documentation

## 2013-01-08 DIAGNOSIS — Z8669 Personal history of other diseases of the nervous system and sense organs: Secondary | ICD-10-CM | POA: Insufficient documentation

## 2013-01-08 DIAGNOSIS — K5090 Crohn's disease, unspecified, without complications: Secondary | ICD-10-CM | POA: Insufficient documentation

## 2013-01-08 DIAGNOSIS — R000 Tachycardia, unspecified: Secondary | ICD-10-CM | POA: Insufficient documentation

## 2013-01-08 DIAGNOSIS — Z880 Allergy status to penicillin: Secondary | ICD-10-CM | POA: Insufficient documentation

## 2013-01-08 DIAGNOSIS — K625 Hemorrhage of anus and rectum: Secondary | ICD-10-CM

## 2013-01-08 DIAGNOSIS — K921 Melena: Secondary | ICD-10-CM | POA: Insufficient documentation

## 2013-01-08 DIAGNOSIS — K5010 Crohn's disease of large intestine without complications: Secondary | ICD-10-CM

## 2013-01-08 DIAGNOSIS — Z8619 Personal history of other infectious and parasitic diseases: Secondary | ICD-10-CM | POA: Insufficient documentation

## 2013-01-08 LAB — BASIC METABOLIC PANEL
BUN: 11 mg/dL (ref 6–23)
CO2: 25 mEq/L (ref 19–32)
Calcium: 9.7 mg/dL (ref 8.4–10.5)
Chloride: 99 mEq/L (ref 96–112)
Creatinine, Ser: 0.73 mg/dL (ref 0.50–1.35)
GFR calc Af Amer: >90 mL/min (ref >90)
GFR calc non Af Amer: >90 mL/min (ref >90)
Glucose, Bld: 156 mg/dL — ABNORMAL HIGH (ref 70–99)
Potassium: 3.6 mEq/L (ref 3.5–5.1)
Sodium: 139 mEq/L (ref 135–145)

## 2013-01-08 LAB — CBC WITH DIFFERENTIAL/PLATELET
Basophils Absolute: 0 10*3/uL (ref 0.0–0.1)
Basophils Relative: 0 % (ref 0–1)
Eosinophils Absolute: 0 10*3/uL (ref 0.0–0.7)
Eosinophils Relative: 0 % (ref 0–5)
HCT: 43.7 % (ref 39.0–52.0)
Hemoglobin: 14.8 g/dL (ref 13.0–17.0)
Lymphocytes Relative: 10 % — ABNORMAL LOW (ref 12–46)
Lymphs Abs: 0.8 10*3/uL (ref 0.7–4.0)
MCH: 32.5 pg (ref 26.0–34.0)
MCHC: 33.9 g/dL (ref 30.0–36.0)
MCV: 96 fL (ref 78.0–100.0)
Monocytes Absolute: 1.1 10*3/uL — ABNORMAL HIGH (ref 0.1–1.0)
Monocytes Relative: 13 % — ABNORMAL HIGH (ref 3–12)
Neutro Abs: 6.1 10*3/uL (ref 1.7–7.7)
Neutrophils Relative %: 76 % (ref 43–77)
Platelets: 412 10*3/uL — ABNORMAL HIGH (ref 150–400)
RBC: 4.55 MIL/uL (ref 4.22–5.81)
RDW: 15 % (ref 11.5–15.5)
WBC: 8 10*3/uL (ref 4.0–10.5)

## 2013-01-08 MED ORDER — HYDROCORTISONE ACETATE 25 MG RE SUPP: 25.0000 mg | Freq: Two times a day (BID) | RECTAL | Status: DC

## 2013-01-08 MED ORDER — SODIUM CHLORIDE 0.9 % IV BOLUS (SEPSIS)
500.0000 mL | Freq: Once | INTRAVENOUS | Status: AC
  Administered 2013-01-08: 500 mL via INTRAVENOUS

## 2013-01-08 NOTE — ED Notes (Signed)
Pt presents to er for further evaluation of rectal bleeding by [name], pt noticed bright red bleeding with bowel movements that started today, does admit to having to strain to have bowel movement, denies any pain.

## 2013-01-08 NOTE — ED Provider Notes (Signed)
CSN: [id number]     Arrival date & time 01/08/13  1555 History   First MD Initiated Contact with Patient 01/08/13 1732     Chief Complaint  Patient presents with  . Rectal Bleeding   (Consider location/radiation/quality/duration/timing/severity/associated sxs/prior Treatment) Patient is a 22 y.o. male presenting with hematochezia. The history is provided by the patient.  Rectal Bleeding Associated symptoms: no abdominal pain and no vomiting    patient has had some blood in the toilet. He states that he was red water was some hard stool. No lightheadedness or dizziness. No abdominal pain. He has a history of Crohn's disease. He also previously has had c-diff. The lightheadedness or dizziness. No other bleeding. He was told by his gastroenterologist, Dr. fields to come to the ED.  Past Medical History  Diagnosis Date  . Crohn's disease 2008 TCS POS SBFT NL    Rx: Prednisone then 6-MP  . Cerebral palsy   . C. difficile colitis NOV 2013   Past Surgical History  Procedure Laterality Date  . 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 Colon cancer Neg Hx   . Colon polyps Neg Hx   . Crohn's disease Neg Hx    History  Substance Use Topics  . Smoking status: Never Smoker   . Smokeless tobacco: Not on file  . Alcohol Use: No    Review of Systems  Constitutional: Negative for activity change and appetite change.  Eyes: Negative for pain.  Respiratory: Negative for chest tightness and shortness of breath.   Cardiovascular: Negative for chest pain and leg swelling.  Gastrointestinal: Positive for blood in stool and hematochezia. Negative for nausea, vomiting, abdominal pain, diarrhea and rectal pain.  Genitourinary: Negative for flank pain.  Musculoskeletal: Negative for back pain and neck stiffness.  Skin: Negative for rash.  Neurological: Negative for weakness, numbness and headaches.  Psychiatric/Behavioral: Negative for  behavioral problems.    Allergies  Other and Amoxicillin  Home Medications   Current Outpatient Rx  Name  Route  Sig  Dispense  Refill  . acetaminophen (TYLENOL) 500 MG tablet   Oral   Take 500 mg by mouth as needed for pain.         . calcium citrate (CALCITRATE - DOSED IN MG ELEMENTAL CALCIUM) 950 MG tablet   Oral   Take 2 tablets by mouth daily.         . folic acid (FOLVITE) 1 MG tablet   Oral   Take 1 mg by mouth daily.         . mercaptopurine (PURINETHOL) 50 MG tablet   Oral   Take 50 mg by mouth daily.          . Probiotic Product (PROBIOTIC PO)   Oral   Take 1 capsule by mouth daily. Contents of capsule mixed with juice and taken today         . hydrocortisone (ANUSOL-HC) 25 MG suppository   Rectal   Place 1 suppository (25 mg total) rectally 2 (two) times daily.   12 suppository   0    BP 142/75  Pulse 133  Temp(Src) 97.6 F (36.4 C) (Oral)  Resp 18  Wt 101 lb 9 oz (46.068 kg)  SpO2 99% Physical Exam  Nursing note and vitals reviewed. Constitutional: He is oriented to person, place, and time. He appears well-developed and well-nourished.  HENT:  Head: Normocephalic and atraumatic.  Eyes: EOM are normal. Pupils are equal, round, and reactive to light.  Neck: Normal range of motion. Neck supple.  Cardiovascular: Regular rhythm and normal heart sounds.   No murmur heard. Tachycardia  Pulmonary/Chest: Effort normal and breath sounds normal.  Abdominal: Soft. Bowel sounds are normal. He exhibits no distension and no mass. There is no tenderness. There is no rebound and no guarding.  Musculoskeletal: Normal range of motion. He exhibits no edema.  Neurological: He is alert and oriented to person, place, and time. No cranial nerve deficit.  Skin: Skin is warm and dry.  Psychiatric: He has a normal mood and affect.    ED Course  Procedures (including critical care time) Labs Review Labs Reviewed  CBC WITH DIFFERENTIAL - Abnormal; Notable  for the following:    Platelets 412 (*)    Lymphocytes Relative 10 (*)    Monocytes Relative 13 (*)    Monocytes Absolute 1.1 (*)    All other components within normal limits  BASIC METABOLIC PANEL - Abnormal; Notable for the following:    Glucose, Bld 156 (*)    All other components within normal limits   Imaging Review No results found.  EKG Interpretation   None       MDM   1. Rectal bleeding   2. Crohn's disease of colon    Patient with rectal bleeding. Rectal exam showed minimal gross blood, but was frankly guaiac positive. Patient is mildly tachycardic, however he has a history of whitecoat syndrome and reportedly gets anxious every time he comes to the ED. He's been seen by cardiology for it and no pathologic causes been found. Discussed with [name], she requested Anusol suppository and she will call the patient's mother for followup.    Jasper Riling. Alvino Chapel, MD 01/08/13 1955

## 2013-01-08 NOTE — ED Notes (Signed)
Pt presents per Dr Oneida Alar request secondary to rectal bleeding. Pt reports blood in commode with BM today and on TP after said BM. Mother reports stool was hard and pt has some Hemorid. Pt has history of crohn's disease  And has not had a flare-up in several yrs. Dr Alvino Chapel at bedside to examine pt. New VS completed, EDP aware of tachycardia. Pt and family report pt becomes very nervous when in hospital. Pt's palms noted to be sweaty. NAD noted at this time. Pt denies N/V/D and fever. Pt also denies pain.

## 2013-01-09 ENCOUNTER — Telehealth: Payer: Self-pay

## 2013-01-09 LAB — OCCULT BLOOD, POC DEVICE: Fecal Occult Bld: POSITIVE — AB

## 2013-01-09 NOTE — Telephone Encounter (Signed)
Pt's mother, Loletha Carrow, called and said pt was seen in the ED over week-end. She was just calling to see when [name] wants to see him in the office or what recommendations she has for him. She knows what is going on, she was on call this week-end. Please advise!

## 2013-01-09 NOTE — Telephone Encounter (Signed)
PT HAVING RECTAL BLEEDING. LAST TCS 2008: CROHN'S COLITIS. SEEN AND EVALUATED BY ED. HB IMPROVED SINCE JAN 2014. NO BRBPR THIS AM. NL BM THIS AM. COMPLETE ANUSOL SUPP. LAB WORK IN JAN 2015. OPV IN FEB 2015 E30 CROHN'S INSTEAD OF MAR 2015. WILL HAVE A LOW THRESHOLD FOR REPEAT TCS. DRINK WATER. EAT FIBER. USE FIBER POWDER ONCE DAILY FOR 3 DAYS THEN TWICE DAILY. HIGHER DOSES MAY CAUSE BLOATING & GAS. MOM WILL CALL WITH QUESTIONS OR CONCERNS.

## 2013-01-11 NOTE — Telephone Encounter (Signed)
Reminder in epic °

## 2013-01-13 ENCOUNTER — Telehealth: Payer: Self-pay

## 2013-01-13 NOTE — Telephone Encounter (Signed)
Pt's mom called, Vickie Rubel. She said the pt was in ED a few days ago and started steroid suppositories and benefiber. Started on Benefiber. This is day to increase the Benefiber. He is having some loose stools and she wanted to let [name] know before it gets out of hand. Please advise!

## 2013-01-15 NOTE — Telephone Encounter (Signed)
MOTHER CALLED PT HAVING LOOSE STOOLS ON BENEFIBER BID. SAW SPECKS OF BLOOD. OVERALL IMPROVED. RECOMMENDED PT REDUCE BENEFIBER TO QD. CONTINUE ANUSOL. CALL WITH QUESTIONS OR CONCERNS.

## 2013-02-20 ENCOUNTER — Telehealth: Payer: Self-pay

## 2013-02-20 NOTE — Telephone Encounter (Signed)
Pt's mom called. Pt has had just a little blood in stool. He is doing well otherwise. He is taking his fiber twice a day. Some hard stools occasionally. They think that it is coming from cheese products. His stools are hard some after the cheese products. She is aware not to worry, call if things worsen and [name] should let her know something tomorrow.

## 2013-02-21 NOTE — Telephone Encounter (Signed)
Called and informed pt's mom, Vickie.

## 2013-02-21 NOTE — Telephone Encounter (Signed)
Pt's mom called back this AM. Tanner Bradley pt is doing fine now. He was constipated some yesterday and he has a little blood when he is constipated, and she wants to know if she can give Benefiber 3 times a day. Please advise!

## 2013-02-21 NOTE — Telephone Encounter (Signed)
PLEASE CALL PT's mother. SHE MAY INCREASE THE BENEFIBER TO TID. DRINK WATER TO KEEP HIS URINE LIGHT YELLOW. CONTINUE TO MONITOR SYMPTOMS. CALL WITH QUESTIONS OR CONCERNS.

## 2013-02-23 ENCOUNTER — Telehealth: Payer: Self-pay | Admitting: Gastroenterology

## 2013-02-23 NOTE — Telephone Encounter (Signed)
January recall has that labs are due

## 2013-02-27 ENCOUNTER — Telehealth: Payer: Self-pay

## 2013-02-27 NOTE — Telephone Encounter (Signed)
Pt's mom said that the clot was just superficial around the outside of a small turd, and it was very small. She said he had regular stools this AM, but he does have to strain some. Appt is made in urgent for Tues 02/28/2013 at 1:15 with LL.

## 2013-02-27 NOTE — Telephone Encounter (Signed)
Please find out how big blood clot was. Is he still passing hard stool? I doubt IBD flare without having loose stool. We could offer him urgent spot Tuesday or Wednesday.

## 2013-02-27 NOTE — Telephone Encounter (Signed)
Pt's mom called and said that pt had blood clot in his stool on Sat and yesterday he had a BM and had A LOT of blood in his stool. He usually has a couple of normal stools daily. He is taking Benefiber three times a day and drinking lots of water as [name] recommended. He had normal BM and no blood in stool this AM. She is insisting that pt come in the office today to be seen, she just wants to make sure he is seen before Christmas holidays. She is aware that [name] is out of the office this week. Please advise!

## 2013-02-27 NOTE — Telephone Encounter (Signed)
Noted  

## 2013-02-28 ENCOUNTER — Encounter: Payer: Self-pay | Admitting: Gastroenterology

## 2013-02-28 ENCOUNTER — Ambulatory Visit (INDEPENDENT_AMBULATORY_CARE_PROVIDER_SITE_OTHER): Payer: Medicaid Other | Admitting: Gastroenterology

## 2013-02-28 ENCOUNTER — Encounter (INDEPENDENT_AMBULATORY_CARE_PROVIDER_SITE_OTHER): Payer: Self-pay

## 2013-02-28 VITALS — BP 121/79 | HR 129 | Temp 97.6°F | Wt 103.0 lb

## 2013-02-28 DIAGNOSIS — Z79899 Other long term (current) drug therapy: Secondary | ICD-10-CM

## 2013-02-28 DIAGNOSIS — K5900 Constipation, unspecified: Secondary | ICD-10-CM

## 2013-02-28 DIAGNOSIS — K625 Hemorrhage of anus and rectum: Secondary | ICD-10-CM | POA: Insufficient documentation

## 2013-02-28 LAB — CBC WITH DIFFERENTIAL/PLATELET
Basophils Absolute: 0 10*3/uL (ref 0.0–0.1)
Basophils Relative: 1 % (ref 0–1)
Eosinophils Absolute: 0 10*3/uL (ref 0.0–0.7)
Eosinophils Relative: 1 % (ref 0–5)
HCT: 39.3 % (ref 39.0–52.0)
Hemoglobin: 13.4 g/dL (ref 13.0–17.0)
Lymphocytes Relative: 13 % (ref 12–46)
Lymphs Abs: 0.7 10*3/uL (ref 0.7–4.0)
MCH: 32.3 pg (ref 26.0–34.0)
MCHC: 34.1 g/dL (ref 30.0–36.0)
MCV: 94.7 fL (ref 78.0–100.0)
Monocytes Absolute: 0.7 10*3/uL (ref 0.1–1.0)
Monocytes Relative: 13 % — ABNORMAL HIGH (ref 3–12)
Neutro Abs: 4 10*3/uL (ref 1.7–7.7)
Neutrophils Relative %: 72 % (ref 43–77)
Platelets: 424 10*3/uL — ABNORMAL HIGH (ref 150–400)
RBC: 4.15 MIL/uL — ABNORMAL LOW (ref 4.22–5.81)
RDW: 16.4 % — ABNORMAL HIGH (ref 11.5–15.5)
WBC: 5.4 10*3/uL (ref 4.0–10.5)

## 2013-02-28 MED ORDER — POLYETHYLENE GLYCOL 3350 17 GM/SCOOP PO POWD: 17.0000 g | Freq: Every day | ORAL | Status: DC

## 2013-02-28 MED ORDER — HYDROCORTISONE ACETATE 25 MG RE SUPP: 25.0000 mg | Freq: Two times a day (BID) | RECTAL | Status: DC

## 2013-02-28 NOTE — Assessment & Plan Note (Signed)
Rectal bleeding likely due to benign anorectal source in setting of constipation. Doubt crohn's flare or malignancy. Discussed at length with patient and mother. We will treat with Anusol BID for 12 days. Add miralax one capful daily to soften stool. Decrease benefiber to twice daily if he develops diarrhea. Go ahead and have labs now. He will come back to see [name] in 3-4 weeks. If still having rectal bleeding, consider colonoscopy.

## 2013-02-28 NOTE — Patient Instructions (Addendum)
1. Anusol suppositories twice daily for 12 days. 2. Start Miralax one capful daily for hard stool. If you develop loose or frequent stools, drop back on Benefiber to twice daily. 3. Office visit with [name] in 03/2013. If you continue to have bleeding, would offer colonoscopy at that time. 4. Please have your lab work done today.

## 2013-02-28 NOTE — Progress Notes (Signed)
Primary Care Physician: [name], MD  Primary Gastroenterologist:  Barney Drain, MD   Chief Complaint  Patient presents with  . Rectal Bleeding  . Abdominal Pain    HPI: Tanner Bradley is a 22 y.o. male here for urgent OV secondary to rectal bleeding. H/O Crohn's disease. Doing well until 01/08/13. Episode of straining to have BM. [place] 1 and 2. 1/2 bright red blood noted in the toilet. Went to ER for evaluation. Noted to have blood on glove on exam but no other abnormalities. Given anusol suppositories. Did fine until last week. He had several more episodes of fresh blood per rectum noted only with passing hard stool. All episodes occurred after eating cheese. Denies abdominal pain/rectal pain, diarrhea, vomiting. Weight is up two pounds. Usually has two stools daily, bristol 4 up until 01/2013. Sunday had to strain, saw a little blood clot on end of stool. Normal BM today.   May have hard stool in morning but soft by nighttime.    Current Outpatient Prescriptions  Medication Sig Dispense Refill  . acetaminophen (TYLENOL) 500 MG tablet Take 500 mg by mouth as needed for pain.      . calcium citrate (CALCITRATE - DOSED IN MG ELEMENTAL CALCIUM) 950 MG tablet Take 2 tablets by mouth daily.      . folic acid (FOLVITE) 1 MG tablet Take 1 mg by mouth daily.      . mercaptopurine (PURINETHOL) 50 MG tablet Take 50 mg by mouth daily.       . Probiotic Product (PROBIOTIC PO) Take 1 capsule by mouth daily. Contents of capsule mixed with juice and taken today      . Wheat Dextrin (BENEFIBER PO) Take by mouth 3 (three) times daily.       No current facility-administered medications for this visit.    Allergies as of 02/28/2013 - Review Complete 02/28/2013  Allergen Reaction Noted  . Other Other (See Comments) 01/08/2013  . Amoxicillin Rash 10/16/2010    ROS:  General: Negative for anorexia, weight loss, fever, chills, fatigue, weakness. ENT: Negative for hoarseness,  difficulty swallowing , nasal congestion. CV: Negative for chest pain, angina, palpitations, dyspnea on exertion, peripheral edema.  Respiratory: Negative for dyspnea at rest, dyspnea on exertion, cough, sputum, wheezing.  GI: See history of present illness. GU:  Negative for dysuria, hematuria, urinary incontinence, urinary frequency, nocturnal urination.  Endo: Negative for unusual weight change.    Physical Examination:   BP 121/79  Pulse 129  Temp(Src) 97.6 F (36.4 C) (Oral)  Wt 103 lb (46.72 kg)  General: Well-nourished, well-developed in no acute distress.  Eyes: No icterus. Mouth: Oropharyngeal mucosa moist and pink , no lesions erythema or exudate. Lungs: Clear to auscultation bilaterally.  Heart: Regular rate and rhythm, no murmurs rubs or gallops.  Abdomen: Bowel sounds are normal, nontender, nondistended, no hepatosplenomegaly or masses, no abdominal bruits or hernia , no rebound or guarding.   Rectal: no external lesions, no tenderness, no masses. Small amount of bloody mucous noted. Heme +. Extremities: No lower extremity edema. No clubbing or deformities. Neuro: Alert and oriented x 4   Skin: Warm and dry, no jaundice.   Psych: Alert and cooperative, normal mood and affect.  Labs:  Lab Results  Component Value Date   WBC 8.0 01/08/2013   HGB 14.8 01/08/2013   HCT 43.7 01/08/2013   MCV 96.0 01/08/2013   PLT 412* 01/08/2013   Lab Results  Component Value Date  ALT 9 03/24/2012   AST 13 03/24/2012   ALKPHOS 59 03/24/2012   BILITOT 0.4 03/24/2012    Imaging Studies: No results found.

## 2013-03-01 LAB — HEPATIC FUNCTION PANEL
ALT: 10 U/L (ref 0–53)
AST: 14 U/L (ref 0–37)
Albumin: 3.7 g/dL (ref 3.5–5.2)
Alkaline Phosphatase: 60 U/L (ref 39–117)
Bilirubin, Direct: 0.1 mg/dL (ref 0.0–0.3)
Indirect Bilirubin: 0.3 mg/dL (ref 0.0–0.9)
Total Bilirubin: 0.4 mg/dL (ref 0.3–1.2)
Total Protein: 6.8 g/dL (ref 6.0–8.3)

## 2013-03-01 NOTE — Progress Notes (Signed)
CC'd to pcp

## 2013-03-06 NOTE — Telephone Encounter (Signed)
Pt just had some labs. He has appt with DR. Fields on 04/05/2013.

## 2013-03-07 NOTE — Progress Notes (Signed)
Quick Note:  Called and informed pt's mom, Vickie. ______

## 2013-03-08 NOTE — Telephone Encounter (Signed)
No additional labs needed per SF.

## 2013-03-27 ENCOUNTER — Other Ambulatory Visit: Payer: Self-pay | Admitting: Gastroenterology

## 2013-04-05 ENCOUNTER — Ambulatory Visit: Payer: Medicaid Other | Admitting: Gastroenterology

## 2013-04-27 ENCOUNTER — Encounter: Payer: Self-pay | Admitting: Gastroenterology

## 2013-04-27 ENCOUNTER — Ambulatory Visit (INDEPENDENT_AMBULATORY_CARE_PROVIDER_SITE_OTHER): Payer: Medicaid Other | Admitting: Gastroenterology

## 2013-04-27 ENCOUNTER — Encounter (INDEPENDENT_AMBULATORY_CARE_PROVIDER_SITE_OTHER): Payer: Self-pay

## 2013-04-27 VITALS — BP 120/85 | HR 122 | Temp 98.3°F | Wt 104.4 lb

## 2013-04-27 DIAGNOSIS — K625 Hemorrhage of anus and rectum: Secondary | ICD-10-CM

## 2013-04-27 DIAGNOSIS — K5010 Crohn's disease of large intestine without complications: Secondary | ICD-10-CM

## 2013-04-27 DIAGNOSIS — D649 Anemia, unspecified: Secondary | ICD-10-CM | POA: Insufficient documentation

## 2013-04-27 NOTE — Assessment & Plan Note (Signed)
RESOLVED WITH ANUSOL HC AND MIRALAX.  [place]. AVOID CONSTIPATION. DRINK WATER OPV JUN 2015

## 2013-04-27 NOTE — Assessment & Plan Note (Addendum)
MOST LIKELY DUE TO CHRONIC DISEASE/BRBPR. NO EVIDENCE FOR ACTIVE GI BLEED OR ACTIVE CROHN'S TODAY.  REPEAT LABS JUN 2015.

## 2013-04-27 NOTE — Progress Notes (Signed)
   Subjective:    Patient ID: Tanner Bradley, male    DOB: 1991-01-30, 23 y.o.   MRN: [id number]  HPI MOVED INTO [place] HOUSE JAN 2015. HAD A SMALL COLD IN JAN. WITH DAYQUIL GOT THE RUNS AND THEN HARD BALL STOOLS WITH TINY DROP OF BLOOD. APPETITE GREAT . EATING BETTER POOPYS GOOD. FEELING GOOD. GOT A BIGGER ROOM WITH A FIREPLACE. STILL ON CIB BID. BMs: #4. LAST VISIT WITH DR. NIDA. PENDING OPV/BONE DENSITY. PT DENIES FEVER, CHILLS, BRBPR, nausea, vomiting, diarrhea, constipation, OR abd pain.  Past Medical History  Diagnosis Date  . Crohn's disease 2008 TCS POS SBFT NL    Rx: Prednisone then 6-MP  . Cerebral palsy   . C. difficile colitis NOV 2013   Past Surgical History  Procedure Laterality Date  . 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 Other Other (See Comments)    Certain antibiotics CAN NOT be taken due to Crohn's disease   . Amoxicillin Rash    Current Outpatient Prescriptions  Medication Sig Dispense Refill  . acetaminophen (TYLENOL) 500 MG tablet Take 500 mg by mouth as needed for pain.      . calcium citrate (CALCITRATE - DOSED IN MG ELEMENTAL CALCIUM) 950 MG tablet Take 2 tablets by mouth daily.      . folic acid (FOLVITE) 1 MG tablet Take 1 mg by mouth daily.      . mercaptopurine (PURINETHOL) 50 MG tablet TAKE 1 TABLET DAILY, GIVE ON EMPTY STOMACH 1 HOUR BEFORE OR 2 HOURS AFTER MEALS.    Marland Kitchen polyethylene glycol powder (GLYCOLAX/MIRALAX) powder Take 17 g by mouth daily. EVERY OTHER DAY   . Probiotic Product (PROBIOTIC PO) Take 1 capsule by mouth daily. Contents of capsule mixed with juice and taken today    . Wheat Dextrin (BENEFIBER PO) Take by mouth 3 (three) times daily. ONCE EVERY OTHER DAY   .      Marland Kitchen             Review of Systems     Objective:   Physical Exam  Vitals reviewed. Constitutional: He is oriented to person, place, and time. He appears well-nourished. No distress.  HENT:  Head: Normocephalic and  atraumatic.  Mouth/Throat: No oropharyngeal exudate.  Eyes: Pupils are equal, round, and reactive to light. No scleral icterus.  Neck: Normal range of motion. Neck supple.  Cardiovascular: Normal rate, regular rhythm and normal heart sounds.   Pulmonary/Chest: Effort normal and breath sounds normal. No respiratory distress.  Abdominal: Soft. Bowel sounds are normal. He exhibits no distension. There is no tenderness.  Musculoskeletal: He exhibits no edema.  Neurological: He is alert and oriented to person, place, and time.  NO  NEW FOCAL DEFICITS   Psychiatric: He has a normal mood and affect.          Assessment & Plan:

## 2013-04-27 NOTE — Progress Notes (Signed)
cc'd to pcp 

## 2013-04-27 NOTE — Patient Instructions (Signed)
[place].  FOLLOW A HIGH FIBER DIET. SEE INFO BELOW.  EAT DOUBLE PORTIONS OF MEAT.  AVOID CONSTIPATION.  DRINK WATER TO KEEP YOUR URINE LIGHT YELLOW.  FOLLOW UP IN JUN 2015.  High-Fiber Diet A high-fiber diet changes your normal diet to include more whole grains, legumes, fruits, and vegetables. Changes in the diet involve replacing refined carbohydrates with unrefined foods. The calorie level of the diet is essentially unchanged. The Dietary Reference Intake (recommended amount) for adult males is 38 grams per day. For adult females, it is 25 grams per day. Pregnant and lactating women should consume 28 grams of fiber per day. Fiber is the intact part of a plant that is not broken down during digestion. Functional fiber is fiber that has been isolated from the plant to provide a beneficial effect in the body. PURPOSE  Increase stool bulk.   Ease and regulate bowel movements.   Lower cholesterol.  INDICATIONS THAT YOU NEED MORE FIBER  Constipation and hemorrhoids.   Uncomplicated diverticulosis (intestine condition) and irritable bowel syndrome.   Weight management.   As a protective measure against hardening of the arteries (atherosclerosis), diabetes, and cancer.   GUIDELINES FOR INCREASING FIBER IN THE DIET  Start adding fiber to the diet slowly. A gradual increase of about 5 more grams (2 slices of whole-wheat bread, 2 servings of most fruits or vegetables, or 1 bowl of high-fiber cereal) per day is best. Too rapid an increase in fiber may result in constipation, flatulence, and bloating.   Drink enough water and fluids to keep your urine clear or pale yellow. Water, juice, or caffeine-free drinks are recommended. Not drinking enough fluid may cause constipation.   Eat a variety of high-fiber foods rather than one type of fiber.   Try to increase your intake of fiber through using high-fiber foods rather than fiber pills or supplements that contain  small amounts of fiber.   The goal is to change the types of food eaten. Do not supplement your present diet with high-fiber foods, but replace foods in your present diet.  INCLUDE A VARIETY OF FIBER SOURCES  Replace refined and processed grains with whole grains, canned fruits with fresh fruits, and incorporate other fiber sources. White rice, white breads, and most bakery goods contain little or no fiber.   Brown whole-grain rice, buckwheat oats, and many fruits and vegetables are all good sources of fiber. These include: broccoli, Brussels sprouts, cabbage, cauliflower, beets, sweet potatoes, white potatoes (skin on), carrots, tomatoes, eggplant, squash, berries, fresh fruits, and dried fruits.   Cereals appear to be the richest source of fiber. Cereal fiber is found in whole grains and bran. Bran is the fiber-rich outer coat of cereal grain, which is largely removed in refining. In whole-grain cereals, the bran remains. In breakfast cereals, the largest amount of fiber is found in those with "bran" in their names. The fiber content is sometimes indicated on the label.   You may need to include additional fruits and vegetables each day.   In baking, for 1 cup white flour, you may use the following substitutions:   1 cup whole-wheat flour minus 2 tablespoons.   1/2 cup white flour plus 1/2 cup whole-wheat flour.

## 2013-04-27 NOTE — Assessment & Plan Note (Signed)
SX CONTROLLED  6-MP REFILLS THROUGH NOV 2015. DOUBLE PORTIONS OF MEAT OPV IN June 2015

## 2013-05-02 NOTE — Progress Notes (Signed)
Reminder in epic °

## 2013-05-15 ENCOUNTER — Telehealth: Payer: Self-pay | Admitting: Gastroenterology

## 2013-05-15 NOTE — Telephone Encounter (Signed)
I called and spoke to [name]. She said that they had a friend who died recently and they began to realize they need wills. They have an attorney who is working on a will for each of the family. The lawyer is needing a physician to write a letter stating that Tanner Bradley is of a sound mind and able to tell what he wants in his will.  She said he has a [employer] from a Therapist, nutritional at his delivery at birth. He also qualified for Medicaid and SSI.   She wants to know if [name] will be able to write a letter for them since she is the main doctor who has seen him for years.  If she cannot, can she refer him to neurology so they can make this decision?  Please advise!

## 2013-05-15 NOTE — Telephone Encounter (Signed)
Pt's mother called regarding her son having a will drawn up (special needs trust). She needs a letter stated that the patient is competent to have a will made up. Mother is VERY anxious with multiple questions regarding this matter. Please advise and call her at [phone]

## 2013-05-16 NOTE — Telephone Encounter (Signed)
SPOKE WITH PT'S MOM. WILL PROVIDE LETTER REGARDING PT'S DECISION MAKING CAPACITY. WILL MAIL OR PICK UP LETTER TOMORROW BEFORE COB.

## 2013-05-17 ENCOUNTER — Encounter: Payer: Self-pay | Admitting: General Practice

## 2013-05-17 NOTE — Telephone Encounter (Signed)
Called and informed pt's mom. She prefers to pick the letter up here at the office and it will be at front for her.

## 2013-05-17 NOTE — Telephone Encounter (Signed)
PLEASE CALL PT's mother. HIS LETTER IS COMPLETE AND IN THE MAIL. MAIL TO HOME OF RECORD.

## 2013-06-14 ENCOUNTER — Other Ambulatory Visit (HOSPITAL_COMMUNITY): Payer: Self-pay | Admitting: "Endocrinology

## 2013-06-14 DIAGNOSIS — M8580 Other specified disorders of bone density and structure, unspecified site: Secondary | ICD-10-CM

## 2013-06-21 ENCOUNTER — Telehealth: Payer: Self-pay

## 2013-06-21 DIAGNOSIS — K5010 Crohn's disease of large intestine without complications: Secondary | ICD-10-CM

## 2013-06-21 NOTE — Telephone Encounter (Signed)
Pt's is due for some labs in June. What labs would you like for him to have and would it be ok for him to have them done in May. Please advise

## 2013-06-23 ENCOUNTER — Ambulatory Visit (HOSPITAL_COMMUNITY)
Admission: RE | Admit: 2013-06-23 | Discharge: 2013-06-23 | Disposition: A | Discharge status: Home / Self Care | Payer: Medicaid Other | Source: Ambulatory Visit | Attending: "Endocrinology | Admitting: "Endocrinology

## 2013-06-23 DIAGNOSIS — M8580 Other specified disorders of bone density and structure, unspecified site: Secondary | ICD-10-CM

## 2013-06-23 DIAGNOSIS — M949 Disorder of cartilage, unspecified: Principal | ICD-10-CM

## 2013-06-23 DIAGNOSIS — M899 Disorder of bone, unspecified: Secondary | ICD-10-CM | POA: Insufficient documentation

## 2013-06-29 NOTE — Telephone Encounter (Signed)
Mother is calling again, wants lab orders to go have his labs done , please advise

## 2013-06-29 NOTE — Telephone Encounter (Signed)
Route to [facility]

## 2013-06-29 NOTE — Assessment & Plan Note (Signed)
LABS JUN 2015

## 2013-06-29 NOTE — Telephone Encounter (Signed)
PT NEEDS CBC/DIFF, CMP.

## 2013-06-30 LAB — CBC WITH DIFFERENTIAL/PLATELET
Basophils Absolute: 0 10*3/uL (ref 0.0–0.1)
Basophils Relative: 1 % (ref 0–1)
Eosinophils Absolute: 0.1 10*3/uL (ref 0.0–0.7)
Eosinophils Relative: 3 % (ref 0–5)
HCT: 41.2 % (ref 39.0–52.0)
Hemoglobin: 13.9 g/dL (ref 13.0–17.0)
Lymphocytes Relative: 24 % (ref 12–46)
Lymphs Abs: 1.1 10*3/uL (ref 0.7–4.0)
MCH: 32.9 pg (ref 26.0–34.0)
MCHC: 33.7 g/dL (ref 30.0–36.0)
MCV: 97.6 fL (ref 78.0–100.0)
Monocytes Absolute: 0.5 10*3/uL (ref 0.1–1.0)
Monocytes Relative: 12 % (ref 3–12)
Neutro Abs: 2.6 10*3/uL (ref 1.7–7.7)
Neutrophils Relative %: 60 % (ref 43–77)
Platelets: 375 10*3/uL (ref 150–400)
RBC: 4.22 MIL/uL (ref 4.22–5.81)
RDW: 14.9 % (ref 11.5–15.5)
WBC: 4.4 10*3/uL (ref 4.0–10.5)

## 2013-06-30 NOTE — Telephone Encounter (Signed)
Pt's mother will come by to pick up lab orders

## 2013-07-03 LAB — COMPLETE METABOLIC PANEL WITH GFR

## 2013-07-05 NOTE — Telephone Encounter (Signed)
HIS BLOOD COUNT IS NORMAL. WE ARE WAITING ON HIS LIVER RESULTS.

## 2013-07-06 NOTE — Telephone Encounter (Addendum)
PLEASE CALL PT. HIS BLOOD COUNT IS NORMAL. His liver tests are normal

## 2013-07-06 NOTE — Telephone Encounter (Signed)
Called and informed pt's mom, Vickie.

## 2013-07-06 NOTE — Telephone Encounter (Signed)
I spoke to [place] at [facility] and she said that Dr. Dorris Fetch also ordered CMP and he should be faxing a copy to [place].  I called Dr. Liliane Channel and they will fax copy to [place].

## 2013-08-02 ENCOUNTER — Encounter: Payer: Self-pay | Admitting: Gastroenterology

## 2013-08-02 ENCOUNTER — Telehealth: Payer: Self-pay | Admitting: *Deleted

## 2013-08-02 ENCOUNTER — Encounter (INDEPENDENT_AMBULATORY_CARE_PROVIDER_SITE_OTHER): Payer: Self-pay

## 2013-08-02 ENCOUNTER — Ambulatory Visit (INDEPENDENT_AMBULATORY_CARE_PROVIDER_SITE_OTHER): Payer: Medicaid Other | Admitting: Gastroenterology

## 2013-08-02 VITALS — BP 126/82 | HR 112 | Temp 98.1°F | Ht 65.0 in | Wt 112.8 lb

## 2013-08-02 DIAGNOSIS — K5010 Crohn's disease of large intestine without complications: Secondary | ICD-10-CM

## 2013-08-02 NOTE — Assessment & Plan Note (Signed)
SX CONTROLLED.  CONTINUE PROBIOTIC. CONTINUE YOUR WEIGHT GAIN EFFORTS. CHECK LABS 1 WEEK PRIOR TO VISIT IN OCT 2015. OPV OCT 2015

## 2013-08-02 NOTE — Progress Notes (Signed)
   Subjective:    Patient ID: Tanner Bradley, male    DOB: 1990/08/02, 23 y.o.   MRN: [id number]  Tanner Percy, MD  HPI GAINING WEIGHT. HAVING #4 STOOL. OSTEOPENIA BETTER. LAST BONE DENSITY BETTER. TAKING VIT D2. BMs: EVERY DAY. TAKING POWDER 2X/WEEK IF CHANGING HIS DIET. NO ORANGE CHEESE. PT DENIES FEVER, CHILLS, BRBPR, nausea, vomiting, melena, diarrhea, constipation, abd pain, problems swallowing, OR heartburn or indigestion. NO SORES IN MOUTH, RASH ON LEGS, JOINT PAIN, OR BACK PAIN.  Past Medical History  Diagnosis Date  . Crohn's disease 2008 TCS POS SBFT NL    Rx: Prednisone then 6-MP  . Cerebral palsy   . C. difficile colitis NOV 2013   Past Surgical History  Procedure Laterality Date  . 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 Other Other (See Comments)    Certain antibiotics CAN NOT be taken due to Crohn's disease   . Amoxicillin Rash   Current Outpatient Prescriptions  Medication Sig Dispense Refill  . acetaminophen (TYLENOL) 500 MG tablet Take 500 mg by mouth as needed for pain.      . calcium citrate (CALCITRATE - DOSED IN MG ELEMENTAL CALCIUM) 950 MG tablet Take 2 tablets by mouth daily.      . Ergocalciferol (VITAMIN D2 PO) Take by mouth once a week.      . folic acid (FOLVITE) 1 MG tablet Take 1 mg by mouth daily.      . mercaptopurine (PURINETHOL) 50 MG tablet TAKE 1 TABLET DAILY, GIVE ON EMPTY STOMACH 1 HOUR BEFORE OR 2 HOURS AFTER MEALS.    Marland Kitchen polyethylene glycol powder (GLYCOLAX/MIRALAX) powder Take 17 g by mouth daily. PRN   . Probiotic Product (PROBIOTIC PO) Take 1 capsule by mouth daily. Contents of capsule mixed with juice and taken today  DAILY    . Wheat Dextrin (BENEFIBER PO) Take by mouth 3 (three) times daily.  PRN        Review of Systems     Objective:   Physical Exam  Vitals reviewed. Constitutional: He is oriented to person, place, and time. He appears well-nourished. No distress.  HENT:    Head: Normocephalic and atraumatic.  Mouth/Throat: Oropharynx is clear and moist. No oropharyngeal exudate.  Eyes: Pupils are equal, round, and reactive to light. No scleral icterus.  Neck: Normal range of motion. Neck supple.  Cardiovascular: Normal rate, regular rhythm and normal heart sounds.   Pulmonary/Chest: Effort normal and breath sounds normal. No respiratory distress.  Abdominal: Soft. Bowel sounds are normal. He exhibits no distension. There is no tenderness.  Musculoskeletal: He exhibits no edema.  Lymphadenopathy:    He has no cervical adenopathy.  Neurological: He is alert and oriented to person, place, and time.  NO FOCAL DEFICITS   Psychiatric: He has a normal mood and affect.          Assessment & Plan:

## 2013-08-02 NOTE — Progress Notes (Signed)
Reminder in epic. Pt's mother requested late Oct/early Nov for next OV

## 2013-08-02 NOTE — Patient Instructions (Signed)
CONTINUE PROBIOTIC.  CONTINUE YOUR WEIGHT GAIN EFFORTS.  CHECK LABS 1 WEEK PRIOR TO VISIT IN OCT 2015.  OPV OCT 2015

## 2013-08-02 NOTE — Progress Notes (Signed)
cc'd to pcp 

## 2013-08-02 NOTE — Telephone Encounter (Signed)
Pt's mother was calling to let Ginger know pt is on vitamin D2 1.63m.

## 2013-09-05 ENCOUNTER — Other Ambulatory Visit: Payer: Self-pay | Admitting: Gastroenterology

## 2013-09-05 NOTE — Telephone Encounter (Signed)
Pt's mother called stating pt still has a whole bottle but it is his last one and he will need his folic acid refilled. Please advise

## 2013-09-12 NOTE — Telephone Encounter (Signed)
Routing to Neil Crouch, [place]. Please advise!

## 2013-09-13 NOTE — Telephone Encounter (Signed)
Anna sent RX already on 09/05/13.

## 2013-12-04 ENCOUNTER — Telehealth: Payer: Self-pay | Admitting: Gastroenterology

## 2013-12-04 NOTE — Telephone Encounter (Signed)
PATIENT SCHEDULED FOR November 2015 AND NEEDS LABS.  MOTHER SAID TO SEND THEM TO HER SO SHE CAN GET THEM DONE IN THE NEXT FEW WEEKS

## 2013-12-06 ENCOUNTER — Other Ambulatory Visit: Payer: Self-pay

## 2013-12-06 DIAGNOSIS — D649 Anemia, unspecified: Secondary | ICD-10-CM

## 2013-12-06 DIAGNOSIS — K50919 Crohn's disease, unspecified, with unspecified complications: Secondary | ICD-10-CM

## 2013-12-06 NOTE — Telephone Encounter (Signed)
Lab orders mailed to pt and mom is aware.

## 2013-12-29 LAB — COMPREHENSIVE METABOLIC PANEL
ALT: 12 U/L (ref 0–53)
AST: 16 U/L (ref 0–37)
Albumin: 4.6 g/dL (ref 3.5–5.2)
Alkaline Phosphatase: 62 U/L (ref 39–117)
BUN: 11 mg/dL (ref 6–23)
CO2: 34 mEq/L — ABNORMAL HIGH (ref 19–32)
Calcium: 9.1 mg/dL (ref 8.4–10.5)
Chloride: 100 mEq/L (ref 96–112)
Creat: 0.73 mg/dL (ref 0.50–1.35)
Glucose, Bld: 89 mg/dL (ref 70–99)
Potassium: 4.4 mEq/L (ref 3.5–5.3)
Sodium: 137 mEq/L (ref 135–145)
Total Bilirubin: 0.5 mg/dL (ref 0.2–1.2)
Total Protein: 7.6 g/dL (ref 6.0–8.3)

## 2013-12-29 LAB — CBC WITH DIFFERENTIAL/PLATELET
Basophils Absolute: 0.1 10*3/uL (ref 0.0–0.1)
Basophils Relative: 1 % (ref 0–1)
Eosinophils Absolute: 0.1 10*3/uL (ref 0.0–0.7)
Eosinophils Relative: 2 % (ref 0–5)
HCT: 44.8 % (ref 39.0–52.0)
Hemoglobin: 14.6 g/dL (ref 13.0–17.0)
Lymphocytes Relative: 23 % (ref 12–46)
Lymphs Abs: 1.3 10*3/uL (ref 0.7–4.0)
MCH: 31.8 pg (ref 26.0–34.0)
MCHC: 32.6 g/dL (ref 30.0–36.0)
MCV: 97.6 fL (ref 78.0–100.0)
Monocytes Absolute: 0.5 10*3/uL (ref 0.1–1.0)
Monocytes Relative: 9 % (ref 3–12)
Neutro Abs: 3.8 10*3/uL (ref 1.7–7.7)
Neutrophils Relative %: 65 % (ref 43–77)
Platelets: 413 10*3/uL — ABNORMAL HIGH (ref 150–400)
RBC: 4.59 MIL/uL (ref 4.22–5.81)
RDW: 15 % (ref 11.5–15.5)
WBC: 5.8 10*3/uL (ref 4.0–10.5)

## 2013-12-29 LAB — HEPATIC FUNCTION PANEL
ALT: 12 U/L (ref 0–53)
AST: 16 U/L (ref 0–37)
Albumin: 4.6 g/dL (ref 3.5–5.2)
Alkaline Phosphatase: 62 U/L (ref 39–117)
Bilirubin, Direct: 0.1 mg/dL (ref 0.0–0.3)
Indirect Bilirubin: 0.4 mg/dL (ref 0.2–1.2)
Total Bilirubin: 0.5 mg/dL (ref 0.2–1.2)
Total Protein: 7.6 g/dL (ref 6.0–8.3)

## 2014-01-02 NOTE — Telephone Encounter (Signed)
PLEASE CALL PT's mother. HIS LIVER PANEL AND CBC ARE NORMAL.

## 2014-01-02 NOTE — Telephone Encounter (Signed)
Pt's mother is aware

## 2014-01-25 ENCOUNTER — Ambulatory Visit (INDEPENDENT_AMBULATORY_CARE_PROVIDER_SITE_OTHER): Payer: Medicaid Other | Admitting: Gastroenterology

## 2014-01-25 ENCOUNTER — Encounter: Payer: Self-pay | Admitting: Gastroenterology

## 2014-01-25 VITALS — BP 130/83 | HR 101 | Temp 97.1°F | Ht 65.0 in | Wt 128.0 lb

## 2014-01-25 DIAGNOSIS — K625 Hemorrhage of anus and rectum: Secondary | ICD-10-CM

## 2014-01-25 DIAGNOSIS — K5010 Crohn's disease of large intestine without complications: Secondary | ICD-10-CM

## 2014-01-25 DIAGNOSIS — K5901 Slow transit constipation: Secondary | ICD-10-CM

## 2014-01-25 MED ORDER — MERCAPTOPURINE 50 MG PO TABS: 50.0000 mg | ORAL_TABLET | Freq: Every day | ORAL | Status: DC

## 2014-01-25 NOTE — Progress Notes (Signed)
cc'ed to pcp °

## 2014-01-25 NOTE — Assessment & Plan Note (Signed)
RARE DUE TO CHEESE?  CONTINUE TO MONITOR SYMPTOMS.

## 2014-01-25 NOTE — Progress Notes (Signed)
ON RECALL LIST FOR OV AND LABS

## 2014-01-25 NOTE — Assessment & Plan Note (Signed)
SX CONTROLLED.   CONTINUE MEDS. OK TO GAIN WEIGHT UP TO 140 LBS FOLLOW UP IN 6 MOS. LABS IN APR 2016 OR OCT 2016-AT LEAST 1X/YEAR.

## 2014-01-25 NOTE — Progress Notes (Signed)
   Subjective:    Patient ID: Tanner Bradley, male    DOB: May 03, 1990, 23 y.o.   MRN: [id number]  HPI GAINED WEIGHT: APR 2015 101 lbs. SECOND SERVINGS AND BIGGER HELPINGS. FEELING GREAT. BMs: daily(#4). PIZZA 3 MOS AGO HAD CONSTIPATION AND SAW A LITTLE BIT OF BLOOD. THINKS CHEESE CAUSES CONSTIPATION. SEEING DR. NIDA NEXT APR/MAY 2016.  PT DENIES FEVER, CHILLS, nausea, vomiting, melena, diarrhea, CHEST PAIN, SHORTNESS OF BREATH, abdominal pain, problems swallowing, or heartburn or indigestion.   Past Medical History  Diagnosis Date  . Crohn's disease 2008 TCS POS SBFT NL    Rx: Prednisone then 6-MP  . Cerebral palsy   . C. difficile colitis NOV 2013   Past Surgical History  Procedure Laterality Date  . 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 Other Other (See Comments)    Certain antibiotics CAN NOT be taken due to Crohn's disease   . Amoxicillin Rash    Current Outpatient Prescriptions  Medication Sig Dispense Refill  . acetaminophen (TYLENOL) 500 MG tablet Take 500 mg by mouth as needed for pain.    . calcium citrate (CALCITRATE - DOSED IN MG ELEMENTAL CALCIUM) 950 MG tablet Take 2 tablets by mouth daily.    . Ergocalciferol (VITAMIN D2 PO) Take 1.25 mg by mouth once a week.     . folic acid (FOLVITE) 1 MG tablet Take 1 mg by mouth daily.    . mercaptopurine (PURINETHOL) 50 MG tablet TAKE 1 TABLET DAILY, GIVE ON EMPTY STOMACH 1 HOUR BEFORE OR 2 HOURS AFTER MEALS.    Marland Kitchen polyethylene glycol powder (GLYCOLAX/MIRALAX) powder Take 17 g by mouth daily. As needed    . Probiotic Product (PROBIOTIC PO) Take 1 capsule by mouth daily. Contents of capsule mixed with juice and taken today    . Wheat Dextrin (BENEFIBER PO) Take by mouth 3 (three) times daily.       Review of Systems     Objective:   Physical Exam  Constitutional: He is oriented to person, place, and time. He appears well-developed and well-nourished. No distress.  HENT:    Head: Normocephalic and atraumatic.  Mouth/Throat: Oropharynx is clear and moist. No oropharyngeal exudate.  Eyes: Pupils are equal, round, and reactive to light. No scleral icterus.  Neck: Normal range of motion. Neck supple.  Cardiovascular: Normal rate, regular rhythm and normal heart sounds.   Pulmonary/Chest: Effort normal and breath sounds normal. No respiratory distress.  Abdominal: Soft. Bowel sounds are normal. He exhibits no distension. There is no tenderness.  Musculoskeletal: He exhibits no edema.  Lymphadenopathy:    He has no cervical adenopathy.  Neurological: He is alert and oriented to person, place, and time.  Psychiatric: He has a normal mood and affect.  Vitals reviewed.         Assessment & Plan:

## 2014-01-25 NOTE — Assessment & Plan Note (Signed)
RARE WITH NL Hb.  CONTINUE TO MONITOR SYMPTOMS.

## 2014-01-25 NOTE — Patient Instructions (Signed)
YOU ARE DOING GREAT!!!  CONTINUE MERCAPTOPURINE.  OK TO GAIN WEIGHT UP TO 140 LBS.  FOLLOW UP IN 6 MOS.  LABS IN APR 2016 OR OCT 2016-AT LEAST EVERY October.

## 2014-02-13 ENCOUNTER — Telehealth: Payer: Self-pay | Admitting: Gastroenterology

## 2014-02-13 NOTE — Telephone Encounter (Signed)
I called [facility] and spoke to the pharmacist and pt has refills on both medications. PT is aware.

## 2014-02-13 NOTE — Telephone Encounter (Signed)
[facility] IN [place] TOLD PATIENT TO CALL HERE.  ALMOST OUT OF PRESCRIPTIONS FOR MERCAPTOPURINE, AND FOLIC ACID

## 2014-05-29 ENCOUNTER — Telehealth: Payer: Self-pay | Admitting: Gastroenterology

## 2014-05-29 NOTE — Telephone Encounter (Signed)
ON April RECALL FOR LABS

## 2014-05-31 ENCOUNTER — Other Ambulatory Visit: Payer: Self-pay

## 2014-05-31 DIAGNOSIS — K50119 Crohn's disease of large intestine with unspecified complications: Secondary | ICD-10-CM

## 2014-05-31 NOTE — Telephone Encounter (Signed)
Lab orders mailed to pt.

## 2014-06-04 ENCOUNTER — Telehealth: Payer: Self-pay | Admitting: Gastroenterology

## 2014-06-04 NOTE — Telephone Encounter (Signed)
I returned the call. Pt's mom had taken pt to the dentist and the dentist prescribed a tooth paste and she would like for [name] to check out and let her know if it is OK for pt to take.  [employer] 5000.  Please advise!

## 2014-06-04 NOTE — Telephone Encounter (Addendum)
PATIENT MOM CALLED WITH A QUESTION ABOUT A TOOTHPASTE HER SON WAS PRESCRIBED.  HE HAS CROHNS AND NEEDS TO KNOW IF THIS WILL INTERFERE WITH HIS MEDICINES.  [phone]

## 2014-06-05 NOTE — Telephone Encounter (Signed)
PLEASE CALL PT's mother. OK TO USE PREVIDENT.

## 2014-06-05 NOTE — Telephone Encounter (Signed)
Called, many rings and no answer.

## 2014-06-05 NOTE — Telephone Encounter (Signed)
Mother is aware

## 2014-07-30 ENCOUNTER — Encounter: Payer: Self-pay | Admitting: Gastroenterology

## 2014-07-30 ENCOUNTER — Ambulatory Visit (INDEPENDENT_AMBULATORY_CARE_PROVIDER_SITE_OTHER): Payer: Medicaid Other | Admitting: Gastroenterology

## 2014-07-30 VITALS — BP 132/83 | HR 92 | Temp 98.4°F | Ht 62.0 in | Wt 136.4 lb

## 2014-07-30 DIAGNOSIS — K5010 Crohn's disease of large intestine without complications: Secondary | ICD-10-CM | POA: Diagnosis not present

## 2014-07-30 MED ORDER — BENEFIBER PO POWD: ORAL | Status: AC

## 2014-07-30 MED ORDER — FOLIC ACID 1 MG PO TABS: 1.0000 mg | ORAL_TABLET | Freq: Every day | ORAL | Status: DC

## 2014-07-30 MED ORDER — POLYETHYLENE GLYCOL 3350 17 GM/SCOOP PO POWD: 17.0000 g | Freq: Every day | ORAL | Status: DC

## 2014-07-30 MED ORDER — PROBIOTIC PO CAPS: ORAL_CAPSULE | ORAL | Status: AC

## 2014-07-30 MED ORDER — MERCAPTOPURINE 50 MG PO TABS: 50.0000 mg | ORAL_TABLET | Freq: Every day | ORAL | Status: DC

## 2014-07-30 NOTE — Progress Notes (Signed)
   Subjective:    Patient ID: Tanner Bradley, male    DOB: March 21, 1990, 24 y.o.   MRN: [id number]  [name], MD  HPI CHEESE, PIZZA, GRILLED CHEESE: NO. EATING GREAT. NO NEED FOR CIB. WEIGHT UP TO 136 LBS. DR. Dorris Fetch AT 3:30 PM.   PT DENIES FEVER, CHILLS, HEMATOCHEZIA, nausea, vomiting, melena, diarrhea, CHEST PAIN, SHORTNESS OF BREATH, CHANGE IN BOWEL IN HABITS, constipation, abdominal pain, problems swallowing, problems with sedation, heartburn or indigestion.   Past Medical History  Diagnosis Date  . Crohn's disease 2008 TCS POS SBFT NL    Rx: Prednisone then 6-MP  . Cerebral palsy   . C. difficile colitis NOV 2013   Past Surgical History  Procedure Laterality Date  . 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 Other Other (See Comments)    Certain antibiotics CAN NOT be taken due to Crohn's disease   . Amoxicillin Rash   Current Outpatient Prescriptions  Medication Sig Dispense Refill  . acetaminophen (TYLENOL) 500 MG tablet Take 500 mg by mouth as needed for pain.    . calcium citrate (CALCITRATE - DOSED IN MG ELEMENTAL CALCIUM) 950 MG tablet Take 2 tablets by mouth daily.    . Ergocalciferol (VITAMIN D2 PO) Take 1.25 mg by mouth once a week.     . folic acid (FOLVITE) 1 MG tablet Take 1 mg by mouth daily.    . mercaptopurine (PURINETHOL) 50 MG tablet Take 1 tablet (50 mg total) by mouth daily.     . polyethylene glycol powder (GLYCOLAX/MIRALAX) powder Take 17 g by mouth daily.    . Probiotic Product (PROBIOTIC PO) Take 1 capsule by mouth daily.     . Wheat Dextrin (BENEFIBER PO) Take by mouth 3 (three) times daily.     Review of Systems     Objective:   Physical Exam  Constitutional: He is oriented to person, place, and time. He appears well-developed and well-nourished. No distress.  HENT:  Head: Normocephalic and atraumatic.  Mouth/Throat: Oropharynx is clear and moist. No oropharyngeal exudate.  Eyes: Pupils are  equal, round, and reactive to light. No scleral icterus.  Neck: Normal range of motion. Neck supple.  Cardiovascular: Normal rate, regular rhythm and normal heart sounds.   Pulmonary/Chest: Effort normal and breath sounds normal. No respiratory distress.  Abdominal: Soft. Bowel sounds are normal. He exhibits no distension. There is no tenderness.  Musculoskeletal: He exhibits no edema.  Lymphadenopathy:    He has no cervical adenopathy.  Neurological: He is alert and oriented to person, place, and time.  NO FOCAL DEFICITS   Psychiatric: He has a normal mood and affect.  Vitals reviewed.         Assessment & Plan:

## 2014-07-30 NOTE — Progress Notes (Signed)
Reminder appt made in Epic

## 2014-07-30 NOTE — Assessment & Plan Note (Addendum)
SYMPTOMS CONTROLLED/RESOLVED ON mercaptopurine. ABLE TO TRAVEL AND GO ON OVERNIGHT.   CONTINUE [facility]. REFILL BENEFIBER/MIRALAX/FOLIC ACID. FOLLOW UP IN 6 MOS.

## 2014-07-30 NOTE — Patient Instructions (Addendum)
CONTINUE MERCAPTOPURINE/FOLIC ACID DAILY.  CONTINUE BENEFIBER/PROBIOTIC DAILY.  USE MIRALAX AS NEEDED FOR CONSTIPATION.  FOLLOW UP IN 6 MOS. YOU NEED A BLOOD COUNT AND LIVER ENZYMES CHECKED ONE WEEK PRIOR TO YOU APPT IN NOV.

## 2014-07-31 ENCOUNTER — Other Ambulatory Visit: Payer: Self-pay

## 2014-07-31 DIAGNOSIS — K5090 Crohn's disease, unspecified, without complications: Secondary | ICD-10-CM

## 2014-07-31 NOTE — Progress Notes (Signed)
cc'ed to pcp °

## 2014-08-24 ENCOUNTER — Telehealth: Payer: Self-pay

## 2014-08-24 NOTE — Telephone Encounter (Signed)
REVIEWED. AGREE. NO ADDITIONAL RECOMMENDATIONS. 

## 2014-08-24 NOTE — Telephone Encounter (Signed)
Pt's mother is calling to let us know that his is 5"6 and has grown 4 inches in a year. Also she gave him some day quail and he has had one loose stool. She was worried since he has crohn's and  Being doing good. I told her to keep an eye on it and if he has anymore or abd pain to call and let us know.

## 2014-12-03 ENCOUNTER — Telehealth: Payer: Self-pay | Admitting: Gastroenterology

## 2014-12-03 NOTE — Telephone Encounter (Signed)
[phone] PATIENT MOTHER CALLED TO MAKE SON AN APPT IN November AND WOULD LIKE THE LABS TO BE DONE MAILED TO HER SO SHE CAN GO PRIOR TO APPT

## 2014-12-04 ENCOUNTER — Other Ambulatory Visit: Payer: Self-pay

## 2014-12-04 DIAGNOSIS — K5090 Crohn's disease, unspecified, without complications: Secondary | ICD-10-CM

## 2014-12-04 NOTE — Telephone Encounter (Signed)
I mail her the labs

## 2014-12-18 ENCOUNTER — Telehealth: Payer: Self-pay | Admitting: Gastroenterology

## 2014-12-18 NOTE — Telephone Encounter (Signed)
NEEDS LABS PRIOR TO November OFFICE APPOINTMENT

## 2014-12-18 NOTE — Telephone Encounter (Signed)
Lab orders are on file to be mailed.

## 2014-12-21 LAB — CBC WITH DIFFERENTIAL/PLATELET
Basophils Absolute: 0.1 10*3/uL (ref 0.0–0.1)
Basophils Relative: 1 % (ref 0–1)
Eosinophils Absolute: 0.1 10*3/uL (ref 0.0–0.7)
Eosinophils Relative: 2 % (ref 0–5)
HCT: 47.3 % (ref 39.0–52.0)
Hemoglobin: 15.5 g/dL (ref 13.0–17.0)
Lymphocytes Relative: 21 % (ref 12–46)
Lymphs Abs: 1.1 10*3/uL (ref 0.7–4.0)
MCH: 30.5 pg (ref 26.0–34.0)
MCHC: 32.8 g/dL (ref 30.0–36.0)
MCV: 93.1 fL (ref 78.0–100.0)
MPV: 10.4 fL (ref 8.6–12.4)
Monocytes Absolute: 0.6 10*3/uL (ref 0.1–1.0)
Monocytes Relative: 12 % (ref 3–12)
Neutro Abs: 3.5 10*3/uL (ref 1.7–7.7)
Neutrophils Relative %: 64 % (ref 43–77)
Platelets: 420 10*3/uL — ABNORMAL HIGH (ref 150–400)
RBC: 5.08 MIL/uL (ref 4.22–5.81)
RDW: 14.3 % (ref 11.5–15.5)
WBC: 5.4 10*3/uL (ref 4.0–10.5)

## 2014-12-22 LAB — HEPATIC FUNCTION PANEL
ALT: 11 U/L (ref 9–46)
AST: 16 U/L (ref 10–40)
Albumin: 4.2 g/dL (ref 3.6–5.1)
Alkaline Phosphatase: 75 U/L (ref 40–115)
Bilirubin, Direct: 0.1 mg/dL (ref <=0.2)
Indirect Bilirubin: 0.4 mg/dL (ref 0.2–1.2)
Total Bilirubin: 0.5 mg/dL (ref 0.2–1.2)
Total Protein: 7.7 g/dL (ref 6.1–8.1)

## 2014-12-31 NOTE — Telephone Encounter (Signed)
PLEASE CALL PT. HIS BLOOD COUNT AND LIVER PANEL ARE NORMAL.

## 2015-01-01 NOTE — Telephone Encounter (Signed)
Pt's mom is aware.

## 2015-01-11 ENCOUNTER — Other Ambulatory Visit: Payer: Self-pay | Admitting: "Endocrinology

## 2015-01-11 DIAGNOSIS — M8580 Other specified disorders of bone density and structure, unspecified site: Secondary | ICD-10-CM

## 2015-02-06 ENCOUNTER — Encounter: Payer: Self-pay | Admitting: Gastroenterology

## 2015-02-06 ENCOUNTER — Encounter: Payer: Self-pay | Admitting: "Endocrinology

## 2015-02-06 ENCOUNTER — Ambulatory Visit (INDEPENDENT_AMBULATORY_CARE_PROVIDER_SITE_OTHER): Payer: Medicaid Other | Admitting: Gastroenterology

## 2015-02-06 ENCOUNTER — Ambulatory Visit (INDEPENDENT_AMBULATORY_CARE_PROVIDER_SITE_OTHER): Payer: Medicaid Other | Admitting: "Endocrinology

## 2015-02-06 VITALS — BP 120/88 | HR 113 | Wt 139.2 lb

## 2015-02-06 VITALS — BP 121/81 | HR 113 | Temp 97.9°F | Ht 64.0 in | Wt 139.2 lb

## 2015-02-06 DIAGNOSIS — K5010 Crohn's disease of large intestine without complications: Secondary | ICD-10-CM | POA: Diagnosis not present

## 2015-02-06 DIAGNOSIS — E559 Vitamin D deficiency, unspecified: Secondary | ICD-10-CM | POA: Diagnosis not present

## 2015-02-06 DIAGNOSIS — M8580 Other specified disorders of bone density and structure, unspecified site: Secondary | ICD-10-CM | POA: Diagnosis not present

## 2015-02-06 MED ORDER — POLYETHYLENE GLYCOL 3350 17 GM/SCOOP PO POWD: 17.0000 g | Freq: Every day | ORAL | Status: AC

## 2015-02-06 MED ORDER — MERCAPTOPURINE 50 MG PO TABS: 50.0000 mg | ORAL_TABLET | Freq: Every day | ORAL | Status: DC

## 2015-02-06 MED ORDER — FOLIC ACID 1 MG PO TABS: 1.0000 mg | ORAL_TABLET | Freq: Every day | ORAL | Status: DC

## 2015-02-06 MED ORDER — VITAMIN D (ERGOCALCIFEROL) 1.25 MG (50000 UNIT) PO CAPS: 50000.0000 [IU] | ORAL_CAPSULE | ORAL | Status: DC

## 2015-02-06 NOTE — Progress Notes (Signed)
cc'ed to pcp °

## 2015-02-06 NOTE — Progress Notes (Signed)
   Subjective:    Patient ID: Tanner Bradley, male    DOB: 18-Apr-1990, 24 y.o.   MRN: [id number]  [name], MD  HPI [facility] GREAT. EATING UP A STORM. GAINING WEIGHT. GREEN TEA CAN CAUSE LOOSE STOOLS. LEARNING TO PLAY GUITAR AND BASS. BMs: DAILY #4. OCCASIONAL LOOSE STOOL IF DRINK GREEN TEA. PIZZA CAUSES CONSTIPATION.   PT DENIES FEVER, CHILLS, HEMATOCHEZIA, HEMATEMESIS, nausea, vomiting, melena, diarrhea, CHEST PAIN, SHORTNESS OF BREATH,  CHANGE IN BOWEL IN HABITS, constipation, abdominal pain, problems swallowing, problems with sedation, heartburn or indigestion.   Past Medical History  Diagnosis Date  . Crohn's disease 2008 TCS POS SBFT NL    Rx: Prednisone then 6-MP  . Cerebral palsy   . C. difficile colitis NOV 2013   Past Surgical History  Procedure Laterality Date  . 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 Other Other (See Comments)    Certain antibiotics CAN NOT be taken due to Crohn's disease   . Amoxicillin Rash    Current Outpatient Prescriptions  Medication Sig Dispense Refill  . acetaminophen (TYLENOL) 500 MG tablet Take 500 mg by mouth as needed for pain.    . calcium citrate (CALCITRATE - DOSED IN MG ELEMENTAL CALCIUM) 950 MG tablet Take 2 tablets by mouth daily.    . Ergocalciferol (VITAMIN D2 PO) Take 1.25 mg by mouth once a week.     . folic acid (FOLVITE) 1 MG tablet Take 1 tablet (1 mg total) by mouth daily.    . mercaptopurine (PURINETHOL) 50 MG tablet Take 1 tablet (50 mg total) by mouth daily. Give on an empty stomach 1 hour before or 2 hours after meals. Caution: Chemotherapy.    . polyethylene glycol powder (GLYCOLAX/MIRALAX) powder Take 17 g by mouth daily.    . Probiotic CAPS 1 PO DAILY.    Marland Kitchen Wheat Dextrin (BENEFIBER) POWD 1 SCOOP PO TID AS NEEDED     Review of Systems PER HPI OTHERWISE ALL SYSTEMS ARE NEGATIVE.    Objective:   Physical Exam        Assessment & Plan:

## 2015-02-06 NOTE — Assessment & Plan Note (Signed)
SYMPTOMS CONTROLLED/RESOLVED.  CONTINUE MERCAPTOPURINE. REFILLED MCP/MIRALAX/FOLIC ACID. FOLLOW UP IN 6 MOS.

## 2015-02-06 NOTE — Progress Notes (Signed)
ON RECALL  °

## 2015-02-06 NOTE — Patient Instructions (Signed)
CONTINUE MERCAPTOPURINE.  I REFILLED YOUR MERCAPTOPURINE/MIRALAX/FOLIC ACID.  FOLLOW UP IN 6 MOS.

## 2015-02-06 NOTE — Progress Notes (Signed)
Subjective:    Patient ID: Tanner Bradley, male    DOB: 11/08/1990, PCP [name], MD   Past Medical History  Diagnosis Date  . Crohn's disease ([place]) 2008 TCS POS SBFT NL    Rx: Prednisone then 6-MP  . Cerebral palsy ([place])   . C. difficile colitis NOV 2013   Past Surgical History  Procedure Laterality Date  . Colonoscopy  2008    COLITIS, bX: POS GRANULOMA-->CD   Social History   Social History  . Marital Status: Single    Spouse Name: N/A  . Number of Children: N/A  . Years of Education: N/A   Social History Main Topics  . Smoking status: Never Smoker   . Smokeless tobacco: Not on file  . Alcohol Use: No  . Drug Use: Not on file  . Sexual Activity: Not on file   Other Topics Concern  . Not on file   Social History Narrative   WAS HOME SCHOOLED. HAS ONE SISTER.   MOM DOESN'T DRIVE.   Outpatient Encounter Prescriptions as of 02/06/2015  Medication Sig  . acetaminophen (TYLENOL) 500 MG tablet Take 500 mg by mouth as needed for pain.  . calcium citrate (CALCITRATE - DOSED IN MG ELEMENTAL CALCIUM) 950 MG tablet Take 2 tablets by mouth daily.  . Ergocalciferol (VITAMIN D2 PO) Take 1.25 mg by mouth once a week.   . folic acid (FOLVITE) 1 MG tablet Take 1 tablet (1 mg total) by mouth daily.  . mercaptopurine (PURINETHOL) 50 MG tablet Take 1 tablet (50 mg total) by mouth daily. Give on an empty stomach 1 hour before or 2 hours after meals. Caution: Chemotherapy.  . polyethylene glycol powder (GLYCOLAX/MIRALAX) powder Take 17 g by mouth daily.  . Probiotic CAPS 1 PO DAILY.  Marland Kitchen Wheat Dextrin (BENEFIBER) POWD 1 SCOOP PO TID   No facility-administered encounter medications on file as of 02/06/2015.   ALLERGIES: Allergies  Allergen Reactions  . Other Other (See Comments)    Certain antibiotics CAN NOT be taken due to Crohn's disease   . Amoxicillin Rash   VACCINATION STATUS:  There is no immunization history on file for this patient.  HPI  24 year old male  patient with medical hx significant for Crohn's disease and cerebral palsy. He is here to f/u after being seen in consultation for osteopenia. He is accompanied by his mother who says that he has grown a few inches over the last year, continues to gain weight which is a good thing for him.   he is a status post therapy with  vitamin D. no new complaints. He has gained a total of 42 lbs in 2 yrs.  His previous work up did not show any specific endocrine cause for osteopenia but possibly multifactorial. Pt with remarkable hx for recurrent high dose steroids therapy in the past for Crohn's flare up, currently on Mercaptopurine. He has had malabsorption. No personal hx of fractures.  He denies heat intolerance but has hx of anxiety and tremors. For seizure disorder he was treated with phenobarbital in the past. Never smoked. No family hx of osteoporosis. 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    Review of Systems  Constitutional: +weight gain, - weight loss, no fatigue, no subjective hyperthermia/hypothermia Eyes: no blurry vision, no xerophthalmia ENT: no sore throat, no nodules palpated in throat, no dysphagia/odynophagia, no hoarseness Cardiovascular: no CP/SOB/palpitations/leg swelling Respiratory: no cough/SOB Gastrointestinal: no N/V/D/C Musculoskeletal: no muscle/joint aches  Skin: no rashes Neurological: no tremors/numbness/tingling/dizziness Psychiatric: no depression/anxiety  Objective:    There were no vitals taken for this visit.  Wt Readings from Last 3 Encounters:  02/06/15 139 lb 3.2 oz (63.141 kg)  07/30/14 136 lb 6.4 oz (61.871 kg)  01/25/14 128 lb (58.06 kg)    Physical Exam Constitutional: in NAD Eyes: PERRLA, EOMI, no exophthalmos ENT: moist mucous membranes, no thyromegaly, no cervical lymphadenopathy Cardiovascular: RRR, No MRG Respiratory: CTA B Gastrointestinal: abdomen soft, NT, ND, BS+ Musculoskeletal: no  deformities, strength intact in all 4 Skin: moist, warm, no rashes Neurological: no tremor with outstretched hands, DTR normal in all 4  Results for orders placed or performed in visit on 12/04/14  CBC w/Diff  Result Value Ref Range   WBC 5.4 4.0 - 10.5 K/uL   RBC 5.08 4.22 - 5.81 MIL/uL   Hemoglobin 15.5 13.0 - 17.0 g/dL   HCT 47.3 39.0 - 52.0 %   MCV 93.1 78.0 - 100.0 fL   MCH 30.5 26.0 - 34.0 pg   MCHC 32.8 30.0 - 36.0 g/dL   RDW 14.3 11.5 - 15.5 %   Platelets 420 (H) 150 - 400 K/uL   MPV 10.4 8.6 - 12.4 fL   Neutrophils Relative % 64 43 - 77 %   Neutro Abs 3.5 1.7 - 7.7 K/uL   Lymphocytes Relative 21 12 - 46 %   Lymphs Abs 1.1 0.7 - 4.0 K/uL   Monocytes Relative 12 3 - 12 %   Monocytes Absolute 0.6 0.1 - 1.0 K/uL   Eosinophils Relative 2 0 - 5 %   Eosinophils Absolute 0.1 0.0 - 0.7 K/uL   Basophils Relative 1 0 - 1 %   Basophils Absolute 0.1 0.0 - 0.1 K/uL   Smear Review Criteria for review not met   Hepatic function panel  Result Value Ref Range   Total Bilirubin 0.5 0.2 - 1.2 mg/dL   Bilirubin, Direct 0.1 <=0.2 mg/dL   Indirect Bilirubin 0.4 0.2 - 1.2 mg/dL   Alkaline Phosphatase 75 40 - 115 U/L   AST 16 10 - 40 U/L   ALT 11 9 - 46 U/L   Total Protein 7.7 6.1 - 8.1 g/dL   Albumin 4.2 3.6 - 5.1 g/dL   Complete Blood Count (Most recent): Lab Results  Component Value Date   WBC 5.4 12/21/2014   HGB 15.5 12/21/2014   HCT 47.3 12/21/2014   MCV 93.1 12/21/2014   PLT 420* 12/21/2014   Chemistry (most recent): Lab Results  Component Value Date   NA 137 12/28/2013   K 4.4 12/28/2013   CL 100 12/28/2013   CO2 34* 12/28/2013   BUN 11 12/28/2013   CREATININE 0.73 12/28/2013    Assessment & Plan:   1. Decreased bone mass  His labs were done in October when they were all WNL, except vitamin D still low at 28. His DXA (from April 2015) was done at a different machine and facility , last one at [facility] .  His age matched Z-Score is better  this time compared to 2013. Pt's prior work up rules out thyroid dysfunction, hypogonadism, and hyperparathyroidism. He has normal hepatorenal function, and no diabetes. 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 Patient's current Z-Score is below the expected age range at -0.4 for spine ( was -1.4 2013, -1.8 ( was -2.2 for left femur, -2.4 for right femur in 2013), the diagnosis of  Osteoporosis is not made based on densitometric criteria alone in these age group.  Pt is off therapy with steroids, this will continue help reverse some more bone loss. I will like to avoid bisphosphonate therapy in him because there is no strong data to support benefit from pharmacologic therapy in this age group.  2. Vitamin D deficiency  He is vitamin D replete , but will  resume vitamin D 50,000 units weekly.  I advised pt and his parents to continue optimal use of calcium and vitamin D, steady but cautions exercise programs which involve graduated weight bearing, and avoid risks of falls, and harsh sports and movements to minimize his chance of fractures.   He will RTN in 1 yr with some repeat of basic labs ( CMP, Vitamin D, TFTs) and repeat DXA in April 2017.  There is no explanation for his reported growth. It is possible that he is catching up although significantly delayed. he has gained appropriate amount of weight.he is  IGF1  levels are within normal range. -He has tachycardia which appears to be regular, he may benefit from EKG test. - I advised patient to maintain close follow up with [name], MD for primary care needs. Follow up plan: No Follow-up on file.  Glade Lloyd, MD Phone: [phone]  Fax: [phone]   02/06/2015, 3:01 PM

## 2015-03-05 ENCOUNTER — Telehealth: Payer: Self-pay

## 2015-03-05 NOTE — Telephone Encounter (Signed)
Mother is calling to see if he could take mucinex for a sore throat. I talked with AS and she stated that it would be fine.

## 2015-03-18 ENCOUNTER — Telehealth: Payer: Self-pay

## 2015-03-18 MED ORDER — BUDESONIDE-FORMOTEROL FUMARATE 80-4.5 MCG/ACT IN AERO: 2.0000 | INHALATION_SPRAY | Freq: Two times a day (BID) | RESPIRATORY_TRACT | Status: DC

## 2015-03-18 NOTE — Telephone Encounter (Signed)
PLEASE CALL PT's mother. I WOULD HOLD OFF ON Z-PAK UNLESS HE IS HAVING A PRODUCTIVE COUGH WITH YELLOW SPUTUM THROUGHOUT THE DAY. IF THE COUGH IS THE PROBLEM THEN WOULD START SYMBICORT FOR 2 WEEKS. CALL IN 7 DAYS IF HE IS NOT BETTER OR IF SHE HAS QUESTIONS.

## 2015-03-18 NOTE — Telephone Encounter (Signed)
Pt's mom, Vickie, came by and said pt has been sick some with dry cough and a little bronchitis. He has not had any fever.  She took him to urgent care this AM and he was prescribed Z Pak and a cough suppressant.  She told the doctor she is fearful of the antibiotics and him getting C-Diff.  The doctor said the antibiotic is not mandatory at this time, she can keep and have on hand. She just wants [name] advice about him taking it if he needs it. She is aware that [name] is at the hospital doing procedures and I will send her the message.

## 2015-03-18 NOTE — Telephone Encounter (Signed)
I called and informed mother.

## 2015-06-19 ENCOUNTER — Telehealth: Payer: Self-pay | Admitting: Gastroenterology

## 2015-06-19 MED ORDER — BUDESONIDE-FORMOTEROL FUMARATE 80-4.5 MCG/ACT IN AERO
2.0000 | INHALATION_SPRAY | Freq: Two times a day (BID) | RESPIRATORY_TRACT | Status: DC
Start: 2015-06-19 — End: 2018-05-27

## 2015-06-19 NOTE — Telephone Encounter (Signed)
Forwarding to [name].

## 2015-06-19 NOTE — Telephone Encounter (Signed)
PLEASE CALL PT. Rx for Symbicort has been sent but he will need to see Dr. Nadara Mustard for refills & to discuss in detail his respiratory issues.

## 2015-06-19 NOTE — Addendum Note (Signed)
Addended by: Danie Binder on: 06/19/2015 11:38 PM   Modules accepted: Orders

## 2015-06-19 NOTE — Telephone Encounter (Signed)
Pt's mother Loletha Carrow) called to see if SF would call in another prescription of Symbicort to [facility] in [place]

## 2015-06-20 NOTE — Telephone Encounter (Signed)
Pt's mom is aware.

## 2015-06-25 ENCOUNTER — Other Ambulatory Visit: Payer: Self-pay

## 2015-06-25 MED ORDER — FOLIC ACID 1 MG PO TABS
1.0000 mg | ORAL_TABLET | Freq: Every day | ORAL | Status: DC
Start: 2015-06-25 — End: 2015-08-09

## 2015-06-25 MED ORDER — MERCAPTOPURINE 50 MG PO TABS: 50.0000 mg | ORAL_TABLET | Freq: Every day | ORAL | Status: DC

## 2015-06-25 NOTE — Telephone Encounter (Signed)
RX RENEWED AND SENT.

## 2015-06-25 NOTE — Addendum Note (Signed)
Addended by: Danie Binder on: 06/25/2015 12:02 PM   Modules accepted: Orders

## 2015-06-26 ENCOUNTER — Ambulatory Visit (HOSPITAL_COMMUNITY)
Admission: RE | Admit: 2015-06-26 | Discharge: 2015-06-26 | Disposition: A | Discharge status: Home / Self Care | Payer: Medicaid Other | Source: Ambulatory Visit | Attending: "Endocrinology | Admitting: "Endocrinology

## 2015-06-26 ENCOUNTER — Other Ambulatory Visit (HOSPITAL_COMMUNITY): Payer: Medicaid Other

## 2015-06-26 ENCOUNTER — Other Ambulatory Visit: Payer: Self-pay | Admitting: "Endocrinology

## 2015-06-26 DIAGNOSIS — M8580 Other specified disorders of bone density and structure, unspecified site: Secondary | ICD-10-CM | POA: Diagnosis not present

## 2015-06-27 LAB — BASIC METABOLIC PANEL
BUN: 9 mg/dL (ref 7–25)
CO2: 27 mmol/L (ref 20–31)
Calcium: 9 mg/dL (ref 8.6–10.3)
Chloride: 103 mmol/L (ref 98–110)
Creat: 0.64 mg/dL (ref 0.60–1.35)
Glucose, Bld: 86 mg/dL (ref 65–99)
Potassium: 4.6 mmol/L (ref 3.5–5.3)
Sodium: 140 mmol/L (ref 135–146)

## 2015-06-27 LAB — VITAMIN D 25 HYDROXY (VIT D DEFICIENCY, FRACTURES): Vit D, 25-Hydroxy: 30 ng/mL (ref 30–100)

## 2015-06-27 LAB — TSH: TSH: 2.38 mIU/L (ref 0.40–4.50)

## 2015-06-27 LAB — T4, FREE: Free T4: 1.2 ng/dL (ref 0.8–1.8)

## 2015-07-01 ENCOUNTER — Telehealth: Payer: Self-pay | Admitting: Gastroenterology

## 2015-07-01 NOTE — Telephone Encounter (Signed)
Patients mother called and wanted slf to know that he had his labs done last Wednesday and also his bone density so she could look at them.

## 2015-07-02 NOTE — Telephone Encounter (Signed)
PLEASE CALL PT. I REVIEWED LABS AND BONE DENSITY. HE HAD SOME IMPROVEMENT IN HIS NUMBERS IN THE LEFT HIP. HIS BMP IS NORMAL.  NEXT HFP/CBC IN PCT 2017.

## 2015-07-03 ENCOUNTER — Other Ambulatory Visit: Payer: Self-pay

## 2015-07-03 DIAGNOSIS — K50919 Crohn's disease, unspecified, with unspecified complications: Secondary | ICD-10-CM

## 2015-07-03 NOTE — Telephone Encounter (Signed)
Pt's mom is aware. Lab orders on file for OCT 2017.

## 2015-08-08 ENCOUNTER — Ambulatory Visit (INDEPENDENT_AMBULATORY_CARE_PROVIDER_SITE_OTHER): Payer: Medicaid Other | Admitting: Gastroenterology

## 2015-08-08 ENCOUNTER — Encounter: Payer: Self-pay | Admitting: Gastroenterology

## 2015-08-08 ENCOUNTER — Encounter: Payer: Self-pay | Admitting: "Endocrinology

## 2015-08-08 ENCOUNTER — Ambulatory Visit (INDEPENDENT_AMBULATORY_CARE_PROVIDER_SITE_OTHER): Payer: Medicaid Other | Admitting: "Endocrinology

## 2015-08-08 VITALS — BP 111/76 | HR 93 | Ht 65.0 in | Wt 152.0 lb

## 2015-08-08 VITALS — BP 129/82 | HR 98 | Temp 98.2°F | Ht 66.0 in | Wt 151.0 lb

## 2015-08-08 DIAGNOSIS — M8580 Other specified disorders of bone density and structure, unspecified site: Secondary | ICD-10-CM

## 2015-08-08 DIAGNOSIS — K5901 Slow transit constipation: Secondary | ICD-10-CM | POA: Diagnosis not present

## 2015-08-08 DIAGNOSIS — K625 Hemorrhage of anus and rectum: Secondary | ICD-10-CM

## 2015-08-08 DIAGNOSIS — T380X5A Adverse effect of glucocorticoids and synthetic analogues, initial encounter: Secondary | ICD-10-CM

## 2015-08-08 DIAGNOSIS — K5010 Crohn's disease of large intestine without complications: Secondary | ICD-10-CM

## 2015-08-08 DIAGNOSIS — E559 Vitamin D deficiency, unspecified: Secondary | ICD-10-CM

## 2015-08-08 DIAGNOSIS — M859 Disorder of bone density and structure, unspecified: Secondary | ICD-10-CM

## 2015-08-08 NOTE — Progress Notes (Signed)
ON RECALL  °

## 2015-08-08 NOTE — Progress Notes (Signed)
CC'ED TO PCP 

## 2015-08-08 NOTE — Patient Instructions (Signed)
CONTINUE FOLIC ACID AND MERCAPTOPURINE.  REPEAT IN OCT 2017: LIVER PANEL AND BLOOD COUNT.  FOLLOW UP IN 6 MOS.

## 2015-08-08 NOTE — Progress Notes (Signed)
   Subjective:    Patient ID: Tanner Bradley, male    DOB: Feb 07, 1991, 25 y.o.   MRN: [id number]   [name], MD  HPI BMS: #4 EVERY AM OR PM. FEELS GOOD. GAINING WEIGHT. IF EATS TOO MUCH PIZZA OR BREAD HAS CONSTIPATION. TREATS CONSTIPATION WITH MIRALAX AND BLEEDS A LITTLE BIT WHEN HE PASSES. MOM DOWNLOADED Teng GILL.  PT DENIES FEVER, CHILLS, nausea, vomiting, melena, diarrhea, CHEST PAIN, SHORTNESS OF BREATH, CHANGE IN BOWEL IN HABITS, abdominal pain, problems swallowing, OR heartburn or indigestion.  Past Medical History  Diagnosis Date  . Crohn's disease ([place]) 2008 TCS POS SBFT NL    Rx: Prednisone then 6-MP  . Cerebral palsy ([place])   . C. difficile colitis NOV 2013   Past Surgical History  Procedure Laterality Date  . 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 Other Other (See Comments)    Certain antibiotics CAN NOT be taken due to Crohn's disease   . Amoxicillin Rash   Current Outpatient Prescriptions  Medication Sig Dispense Refill  . TYLENOL 500 MG tablet Take 500 mg by mouth as needed for pain.    . calcium citrate 950 MG tablet Take 2 tablets by mouth daily.    . folic acid (FOLVITE) 1 MG tablet Take 1 tablet (1 mg total) by mouth daily.    Marland Kitchen PURINETHOL 50 MG tablet Take 1 tablet (50 mg total) by mouth daily.     Marland Kitchen GLYCOLAX/MIRALAX powder Take 17 g by mouth daily.    . Probiotic CAPS 1 PO DAILY.    Marland Kitchen Vitamin D, Ergocalciferol Take 1 capsule (50,000 Units total) by mouth every 7 (seven) days.    . Wheat Dextrin (BENEFIBER) POWD 1 SCOOP PO TID    . budesonide-formoterol 80-4.5 MCG/ACT inhaler Inhale 2 puffs into the lungs 2 (two) times daily PRN     Review of Systems PER HPI OTHERWISE ALL SYSTEMS ARE NEGATIVE.    Objective:   Physical Exam  Constitutional: He is oriented to person, place, and time. He appears well-developed and well-nourished. No distress.  HENT:  Head: Normocephalic and atraumatic.    Mouth/Throat: Oropharynx is clear and moist. No oropharyngeal exudate.  Eyes: Pupils are equal, round, and reactive to light. No scleral icterus.  Neck: Normal range of motion. Neck supple.  Cardiovascular: Normal rate, regular rhythm and normal heart sounds.   Pulmonary/Chest: Effort normal and breath sounds normal. No respiratory distress.  Abdominal: Soft. Bowel sounds are normal. He exhibits no distension. There is no tenderness.  Musculoskeletal: He exhibits no edema.  Lymphadenopathy:    He has no cervical adenopathy.  Neurological: He is alert and oriented to person, place, and time.  Psychiatric: He has a normal mood and affect.  Vitals reviewed.     Assessment & Plan:

## 2015-08-08 NOTE — Assessment & Plan Note (Addendum)
SYMPTOMS CONTROLLED/RESOLVED.  CONTINUE FOLIC ACID AND MERCAPTOPURINE. REFILL MEDS x 1YEAR. REPEAT IN OCT 2017: LIVER PANEL AND BLOOD COUNT. FOLLOW UP IN 6 MOS.

## 2015-08-08 NOTE — Progress Notes (Signed)
Subjective:    Patient ID: Tanner Bradley, male    DOB: 10-28-1990, PCP [name], MD   Past Medical History  Diagnosis Date  . Crohn's disease ([place]) 2008 TCS POS SBFT NL    Rx: Prednisone then 6-MP  . Cerebral palsy ([place])   . C. difficile colitis NOV 2013   Past Surgical History  Procedure Laterality Date  . Colonoscopy  2008    COLITIS, bX: POS GRANULOMA-->CD   Social History   Social History  . Marital Status: Single    Spouse Name: N/A  . Number of Children: N/A  . Years of Education: N/A   Social History Main Topics  . Smoking status: Never Smoker   . Smokeless tobacco: None  . Alcohol Use: No  . Drug Use: None  . Sexual Activity: Not Asked   Other Topics Concern  . None   Social History Narrative   WAS HOME SCHOOLED. HAS ONE SISTER.   MOM DOESN'T DRIVE.   Outpatient Encounter Prescriptions as of 08/08/2015  Medication Sig  . acetaminophen (TYLENOL) 500 MG tablet Take 500 mg by mouth as needed for pain.  . budesonide-formoterol (SYMBICORT) 80-4.5 MCG/ACT inhaler Inhale 2 puffs into the lungs 2 (two) times daily. FOR 7 DAYS THE 2 PUFFS DAILY FOR 7 DAYS (Patient not taking: Reported on 08/08/2015)  . calcium citrate (CALCITRATE - DOSED IN MG ELEMENTAL CALCIUM) 950 MG tablet Take 2 tablets by mouth daily.  . folic acid (FOLVITE) 1 MG tablet Take 1 tablet (1 mg total) by mouth daily.  . mercaptopurine (PURINETHOL) 50 MG tablet Take 1 tablet (50 mg total) by mouth daily. Give on an empty stomach 1 hour before or 2 hours after meals. Caution: Chemotherapy.  . polyethylene glycol powder (GLYCOLAX/MIRALAX) powder Take 17 g by mouth daily.  . Probiotic CAPS 1 PO DAILY.  Marland Kitchen Vitamin D, Ergocalciferol, (DRISDOL) 50000 UNITS CAPS capsule Take 1 capsule (50,000 Units total) by mouth every 7 (seven) days.  . Wheat Dextrin (BENEFIBER) POWD 1 SCOOP PO TID   No facility-administered encounter medications on file as of 08/08/2015.   ALLERGIES: Allergies  Allergen Reactions   . Other Other (See Comments)    Certain antibiotics CAN NOT be taken due to Crohn's disease   . Amoxicillin Rash   VACCINATION STATUS:  There is no immunization history on file for this patient.  HPI  25 year old male patient with medical hx significant for Crohn's disease and cerebral palsy. He is here to f/u after being seen in consultation for osteopenia. He is accompanied by his mother , continues to gain weight which is a good thing for him.   he is on therapy with  vitamin D and calcium  no new complaints. He has gained a total of 52 lbs in 2 yrs.  His previous work up did not show any specific endocrine cause for osteopenia but possibly multifactorial. Pt with remarkable hx for recurrent high dose steroids therapy in the past for Crohn's flare up, currently on Mercaptopurine. He has had malabsorption. No personal hx of fractures.  He denies heat intolerance but has hx of anxiety and tremors. For seizure disorder he was treated with phenobarbital in the past. Never smoked. No family hx of osteoporosis. 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    Review of Systems  Constitutional: +weight gain, - weight loss, no fatigue, no subjective hyperthermia/hypothermia Eyes: no blurry vision, no xerophthalmia ENT: no sore throat, no  nodules palpated in throat, no dysphagia/odynophagia, no hoarseness Cardiovascular: no CP/SOB/palpitations/leg swelling Respiratory: no cough/SOB Gastrointestinal: no N/V/D/C Musculoskeletal: no muscle/joint aches Skin: no rashes Neurological: no tremors/numbness/tingling/dizziness Psychiatric: no depression/anxiety  Objective:    BP 111/76 mmHg  Pulse 93  Ht 5' 5"  (1.651 m)  Wt 152 lb (68.947 kg)  BMI 25.29 kg/m2  Wt Readings from Last 3 Encounters:  08/08/15 152 lb (68.947 kg)  08/08/15 151 lb (68.493 kg)  02/06/15 139 lb 4 oz (63.163 kg)    Physical Exam Constitutional: in NAD Eyes: PERRLA, EOMI, no  exophthalmos ENT: moist mucous membranes, no thyromegaly, no cervical lymphadenopathy Cardiovascular: RRR, No MRG Respiratory: CTA B Gastrointestinal: abdomen soft, NT, ND, BS+ Musculoskeletal: no deformities, strength intact in all 4 Skin: moist, warm, no rashes Neurological: no tremor with outstretched hands, DTR normal in all 4  Results for orders placed or performed in visit on 33/00/76  Basic metabolic panel  Result Value Ref Range   Sodium 140 135 - 146 mmol/L   Potassium 4.6 3.5 - 5.3 mmol/L   Chloride 103 98 - 110 mmol/L   CO2 27 20 - 31 mmol/L   Glucose, Bld 86 65 - 99 mg/dL   BUN 9 7 - 25 mg/dL   Creat 0.64 0.60 - 1.35 mg/dL   Calcium 9.0 8.6 - 10.3 mg/dL  TSH  Result Value Ref Range   TSH 2.38 0.40 - 4.50 mIU/L  T4, free  Result Value Ref Range   Free T4 1.2 0.8 - 1.8 ng/dL  VITAMIN D 25 Hydroxy (Vit-D Deficiency, Fractures)  Result Value Ref Range   Vit D, 25-Hydroxy 30 30 - 100 ng/mL   Complete Blood Count (Most recent): Lab Results  Component Value Date   WBC 5.4 12/21/2014   HGB 15.5 12/21/2014   HCT 47.3 12/21/2014   MCV 93.1 12/21/2014   PLT 420* 12/21/2014   Chemistry (most recent): Lab Results  Component Value Date   NA 140 06/26/2015   K 4.6 06/26/2015   CL 103 06/26/2015   CO2 27 06/26/2015   BUN 9 06/26/2015   CREATININE 0.64 06/26/2015    Assessment & Plan:   1. Decreased bone mass  His labs showed  vitamin D better at 30. -He is DEXA scan from April 2017 showed that he has gained some bone mass on this time, no significant change in the hips. His DXA (from April 2015) was done at a different machine and facility , [facility] one at [facility] .  His age matched Z-Score is better this time compared to 2013. Pt's prior work up rules out thyroid dysfunction, hypogonadism, and hyperparathyroidism. He has normal hepatorenal function, and no diabetes. 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 Patient's current Z-Score is still  below the expected age range,  the diagnosis of Osteoporosis is not made based on densitometric criteria alone in these age group.  Pt is off therapy with steroids, this will continue help reverse some more bone loss. I will like to avoid bisphosphonate therapy in him because there is no strong data to support benefit from pharmacologic therapy in this age group.  2. Vitamin D deficiency  He is  vitamin D replete , but will continue to benefit from vitamin D 50,000 units weekly.  I advised pt and his parents to continue optimal use of calcium and vitamin D, steady but cautions exercise programs which involve graduated weight bearing, and avoid  risks of falls, and harsh sports and movements to minimize his chance of fractures.   He will RTN in 1 yr with some repeat of basic labs ( CMP, Vitamin D. -  He will have repeat DEXA scan in April 2019.  He has gained appropriate amount of weight. he is  IGF1  levels are within normal range.  - I advised patient to maintain close follow up with [name], MD for primary care needs.  Follow up plan: Return in about 1 year (around 08/07/2016) for follow up with pre-visit labs.  Glade Lloyd, MD Phone: [phone]  Fax: [phone]   08/08/2015, 3:45 PM

## 2015-08-08 NOTE — Assessment & Plan Note (Addendum)
SYMPTOMS FAIRLY WELL CONTROLLED.  CONTINUE CALCIUM/VITAMIN D. DEXA PER DR. NIDA. FOLLOW UP IN 6 MOS.

## 2015-08-08 NOTE — Assessment & Plan Note (Signed)
SYMPTOMS FAIRLY WELL CONTROLLED. ONLY HAPPENS WITH CONSTIPATION. NL HB OCT 2017.  CONTINUE TO MONITOR SYMPTOMS.

## 2015-08-08 NOTE — Assessment & Plan Note (Signed)
SYMPTOMS FAIRLY WELL CONTROLLED.  MIRALAX PRN EAT FIBER DRINK WATER OPV IN 6 MOS

## 2015-08-09 MED ORDER — FOLIC ACID 1 MG PO TABS: 1.0000 mg | ORAL_TABLET | Freq: Every day | ORAL | Status: DC

## 2015-08-09 MED ORDER — MERCAPTOPURINE 50 MG PO TABS: 50.0000 mg | ORAL_TABLET | Freq: Every day | ORAL | Status: DC

## 2015-08-09 NOTE — Addendum Note (Signed)
Addended by: Danie Binder on: 08/09/2015 09:25 AM   Modules accepted: Orders

## 2015-09-30 ENCOUNTER — Other Ambulatory Visit: Payer: Self-pay | Admitting: Gastroenterology

## 2015-09-30 DIAGNOSIS — K5010 Crohn's disease of large intestine without complications: Secondary | ICD-10-CM

## 2015-11-27 ENCOUNTER — Other Ambulatory Visit: Payer: Self-pay

## 2015-11-27 DIAGNOSIS — K50919 Crohn's disease, unspecified, with unspecified complications: Secondary | ICD-10-CM

## 2015-12-02 ENCOUNTER — Other Ambulatory Visit: Payer: Self-pay | Admitting: "Endocrinology

## 2015-12-02 LAB — CBC WITH DIFFERENTIAL/PLATELET
Basophils Absolute: 65 cells/uL (ref 0–200)
Basophils Relative: 1 %
Eosinophils Absolute: 130 cells/uL (ref 15–500)
Eosinophils Relative: 2 %
HCT: 44.4 % (ref 38.5–50.0)
Hemoglobin: 14.4 g/dL (ref 13.2–17.1)
Lymphocytes Relative: 17 %
Lymphs Abs: 1105 cells/uL (ref 850–3900)
MCH: 29.6 pg (ref 27.0–33.0)
MCHC: 32.4 g/dL (ref 32.0–36.0)
MCV: 91.4 fL (ref 80.0–100.0)
MPV: 11.3 fL (ref 7.5–12.5)
Monocytes Absolute: 650 cells/uL (ref 200–950)
Monocytes Relative: 10 %
Neutro Abs: 4550 cells/uL (ref 1500–7800)
Neutrophils Relative %: 70 %
Platelets: 502 10*3/uL — ABNORMAL HIGH (ref 140–400)
RBC: 4.86 MIL/uL (ref 4.20–5.80)
RDW: 14.7 % (ref 11.0–15.0)
WBC: 6.5 10*3/uL (ref 3.8–10.8)

## 2015-12-02 LAB — COMPLETE METABOLIC PANEL WITH GFR
ALT: 8 U/L — ABNORMAL LOW (ref 9–46)
AST: 14 U/L (ref 10–40)
Albumin: 3.8 g/dL (ref 3.6–5.1)
Alkaline Phosphatase: 69 U/L (ref 40–115)
BUN: 6 mg/dL — ABNORMAL LOW (ref 7–25)
CO2: 26 mmol/L (ref 20–31)
Calcium: 9.2 mg/dL (ref 8.6–10.3)
Chloride: 102 mmol/L (ref 98–110)
Creat: 0.77 mg/dL (ref 0.60–1.35)
GFR, Est African American: >89 mL/min (ref >=60)
GFR, Est Non African American: >89 mL/min (ref >=60)
Glucose, Bld: 95 mg/dL (ref 65–99)
Potassium: 4.6 mmol/L (ref 3.5–5.3)
Sodium: 140 mmol/L (ref 135–146)
Total Bilirubin: 0.3 mg/dL (ref 0.2–1.2)
Total Protein: 7 g/dL (ref 6.1–8.1)

## 2015-12-03 ENCOUNTER — Telehealth: Payer: Self-pay | Admitting: Gastroenterology

## 2015-12-03 LAB — HEPATIC FUNCTION PANEL
ALT: 9 U/L (ref 9–46)
AST: 14 U/L (ref 10–40)
Albumin: 4 g/dL (ref 3.6–5.1)
Alkaline Phosphatase: 74 U/L (ref 40–115)
Bilirubin, Direct: 0.1 mg/dL (ref <=0.2)
Indirect Bilirubin: 0.3 mg/dL (ref 0.2–1.2)
Total Bilirubin: 0.4 mg/dL (ref 0.2–1.2)
Total Protein: 7 g/dL (ref 6.1–8.1)

## 2015-12-03 LAB — PTH, INTACT AND CALCIUM
Calcium: 9.2 mg/dL (ref 8.6–10.3)
PTH: 21 pg/mL (ref 14–64)

## 2015-12-03 LAB — VITAMIN D 25 HYDROXY (VIT D DEFICIENCY, FRACTURES): Vit D, 25-Hydroxy: 27 ng/mL — ABNORMAL LOW (ref 30–100)

## 2015-12-03 NOTE — Telephone Encounter (Signed)
PLEASE CALL PT. HIS LABS ARE NORMAL. HE SHOULD SIGN UP FOR MY CHART.

## 2015-12-03 NOTE — Telephone Encounter (Signed)
Called, many rings and no answer.

## 2015-12-04 NOTE — Telephone Encounter (Signed)
Called. Many rings and no answer. Will mail a letter that labs are normal.

## 2015-12-11 ENCOUNTER — Ambulatory Visit (INDEPENDENT_AMBULATORY_CARE_PROVIDER_SITE_OTHER): Payer: Medicaid Other | Admitting: Gastroenterology

## 2015-12-11 ENCOUNTER — Encounter: Payer: Self-pay | Admitting: Gastroenterology

## 2015-12-11 DIAGNOSIS — E559 Vitamin D deficiency, unspecified: Secondary | ICD-10-CM

## 2015-12-11 DIAGNOSIS — K5010 Crohn's disease of large intestine without complications: Secondary | ICD-10-CM

## 2015-12-11 MED ORDER — VITAMIN D (ERGOCALCIFEROL) 1.25 MG (50000 UNIT) PO CAPS: 50000.0000 [IU] | ORAL_CAPSULE | ORAL | 0 refills | Status: DC

## 2015-12-11 MED ORDER — CHOLECALCIFEROL 10 MCG (400 UNIT) PO TABS: 1000.0000 [IU] | ORAL_TABLET | Freq: Every day | ORAL | Status: DC

## 2015-12-11 MED ORDER — MERCAPTOPURINE 50 MG PO TABS: 50.0000 mg | ORAL_TABLET | Freq: Every day | ORAL | 3 refills | Status: DC

## 2015-12-11 NOTE — Assessment & Plan Note (Signed)
ASSOCIATED WITH OSTEOPENIA.  HE NEEDS VITAMIN D 50,000 UNITS A WEEK FOR 8 WEEKS THEN START ON DEC 4  VITAMIN D 1000 UNITS DAILY FOREVER. SHE NEEDS TO START CALCIUM 500 MG THREE TIMES A DAY FOREVER.   FOLLOW UP IN 6 MOS.

## 2015-12-11 NOTE — Assessment & Plan Note (Addendum)
SYMPTOMS CONTROLLED/RESOLVED. REVIEWED LABS FROM SEP 2017-VIT D 27.  REFILL 6-MPx 1 YEAR. COMPLETE BLOOD DRAW IN APR OR MAY 2017 FOR: HEPATITIS A AND HEP B SURFACE Ab, LIVER PANEL AND BLOOD COUNT.  SEE DERMATOLOGY EVERY YEAR FOR A SKIN EXAM. REPLACE VITAMIN D. PLEASE CALL WITH QUESTIONS OR CONCERNS. WILL NEED TCS IN 2018 FOR SURVEILLANCE. FOLLOW UP IN 6 MOS.

## 2015-12-11 NOTE — Patient Instructions (Addendum)
COMPLETE BLOOD DRAW IN APR OR MAY 2017 FOR: HEPATITIS A AND HEP B SURFACE Ab, LIVER PANEL AND BLOOD COUNT.   SEE DERMATOLOGY EVERY YEAR FOR A SKIN EXAM.  YOU NEED SUPPLEMENTS: VITAMIN D AND CALCIUM. YOU NEED VITAMIN D 50,000 UNITS A WEEK FOR 8 WEEKS THEN ON DEC 4 START VITAMIN D 1000 UNITS DAILY FOREVER. YOU NEED CALCIUM 500 MG THREE TIMES A DAY FOREVER.   PLEASE CALL WITH QUESTIONS OR CONCERNS.  FOLLOW UP IN 6 MOS.

## 2015-12-11 NOTE — Progress Notes (Signed)
   Subjective:    Patient ID: Tanner Bradley, male    DOB: 1990-07-27, 25 y.o.   MRN: [id number]  [name], MD  HPI Weight down 5 LBS SINCE JUN 2017. BOWELS ARE GOOD. WANTS TO KNOW WHERE TO KEEP HIS WEIGHT. LIMITING SPRITES AND CUTTING BACK SWEETS. NOT TAKING VITAMIN D. GETTING EXERCISE. NOT GETTING FLU SHOT DUE TO SEIZURES. HAD dT. NO SKIN EXAM BY DERM EVER.   PT DENIES FEVER, CHILLS, HEMATOCHEZIA, HEMATEMESIS, nausea, vomiting, melena, diarrhea, CHEST PAIN, SHORTNESS OF BREATH, CHANGE IN BOWEL IN HABITS, constipation, abdominal pain, problems swallowing, OR heartburn or indigestion.   Past Medical History:  Diagnosis Date  . C. difficile colitis NOV 2013  . Cerebral palsy ([place])   . Crohn's disease ([place]) 2008 TCS POS SBFT NL   Rx: Prednisone then 6-MP    Past Surgical History:  Procedure Laterality Date  . COLONOSCOPY  2008   COLITIS, bX: POS GRANULOMA-->CD   Allergies  Allergen Reactions  . Other Other (See Comments)    Certain antibiotics CAN NOT be taken due to Crohn's disease   . Amoxicillin Rash   Current Outpatient Prescriptions  Medication Sig Dispense Refill  . acetaminophen (TYLENOL) 500 MG tablet Take 500 mg by mouth as needed for pain.    . calcium citrate (CALCITRATE - DOSED IN MG ELEMENTAL CALCIUM) 950 MG tablet Take 2 tablets by mouth daily.    . folic acid (FOLVITE) 1 MG tablet Take 1 tablet (1 mg total) by mouth daily.    . mercaptopurine (PURINETHOL) 50 MG tablet Take 1 tablet (50 mg total) by mouth daily. Give on an empty stomach 1 hour before or 2 hours after meals. Caution: Chemotherapy.    . polyethylene glycol powder (GLYCOLAX/MIRALAX) powder Take 17 g by mouth daily. (Patient taking differently: Take 17 g by mouth as needed. )    . Probiotic CAPS 1 PO DAILY.    Marland Kitchen Wheat Dextrin (BENEFIBER) POWD 1 SCOOP PO TID (Patient taking differently: as needed. 1 SCOOP PO TID) PRN   .      Marland Kitchen      Review of Systems PER HPI OTHERWISE ALL SYSTEMS ARE NEGATIVE.   Objective:   Physical Exam  Constitutional: He is oriented to person, place, and time. He appears well-developed and well-nourished. No distress.  HENT:  Head: Normocephalic and atraumatic.  Mouth/Throat: Oropharynx is clear and moist. No oropharyngeal exudate.  Eyes: Pupils are equal, round, and reactive to light. No scleral icterus.  Neck: Normal range of motion. Neck supple.  Cardiovascular: Normal rate, regular rhythm and normal heart sounds.   Pulmonary/Chest: Effort normal and breath sounds normal. No respiratory distress.  Abdominal: Soft. Bowel sounds are normal. He exhibits no distension. There is no tenderness.  Musculoskeletal: He exhibits no edema.  Lymphadenopathy:    He has no cervical adenopathy.  Neurological: He is alert and oriented to person, place, and time.  Psychiatric: He has a normal mood and affect.  Vitals reviewed.     Assessment & Plan:

## 2015-12-12 ENCOUNTER — Telehealth: Payer: Self-pay

## 2015-12-12 NOTE — Telephone Encounter (Signed)
T/C from [place], pt's mom, asking about the dermatology referral.  She said she was not sure why [name] referred pt to dermatology. And pt is upset and does not want to go.  They called a dermatologist to find out what a visit consist of and was told pt would need to take off all of his clothes down to his underwear to be assessed. He is just upset and does not to do it.   She said if it is something that [name] feels is necessary, can they please put it off until Spring.  Please advise!

## 2015-12-12 NOTE — Telephone Encounter (Signed)
Noted  

## 2015-12-12 NOTE — Telephone Encounter (Signed)
Dermatology referral cancelled per SLF.

## 2015-12-12 NOTE — Telephone Encounter (Signed)
CALLED PT'S MOTHER AND PT. PT HAVING TREMENDOUS ANXIETY ABOUT GOING TO DERMATOLOGY. HE DOES NOT WANT TO GO GET UNDRESSED FOR THE EXAM. WILL CANCEL DERMATOLOGY REFERRAL.

## 2015-12-12 NOTE — Progress Notes (Signed)
On recall  °

## 2015-12-12 NOTE — Progress Notes (Signed)
cc'ed to pcp °

## 2016-01-27 ENCOUNTER — Other Ambulatory Visit: Payer: Self-pay | Admitting: Gastroenterology

## 2016-01-27 DIAGNOSIS — K5010 Crohn's disease of large intestine without complications: Secondary | ICD-10-CM

## 2016-05-06 ENCOUNTER — Encounter: Payer: Self-pay | Admitting: Gastroenterology

## 2016-05-13 ENCOUNTER — Telehealth: Payer: Self-pay | Admitting: Gastroenterology

## 2016-05-13 NOTE — Telephone Encounter (Signed)
Tanner Bradley is aware pt is due labs around 06/29/2016 and they will be mailed out around 06/07/2016.

## 2016-05-13 NOTE — Telephone Encounter (Signed)
Pt's mother, Loletha Carrow, called to make OV with SF and is aware of OV on May 24 at 10am. She wanted to know if patient was due to have any labs done. Please advise and call [phone]

## 2016-06-01 ENCOUNTER — Other Ambulatory Visit: Payer: Self-pay

## 2016-06-01 DIAGNOSIS — K5010 Crohn's disease of large intestine without complications: Secondary | ICD-10-CM

## 2016-06-05 LAB — HEPATIC FUNCTION PANEL
ALT: 10 U/L (ref 9–46)
AST: 14 U/L (ref 10–40)
Albumin: 3.6 g/dL (ref 3.6–5.1)
Alkaline Phosphatase: 74 U/L (ref 40–115)
Bilirubin, Direct: 0.1 mg/dL (ref <=0.2)
Indirect Bilirubin: 0.2 mg/dL (ref 0.2–1.2)
Total Bilirubin: 0.3 mg/dL (ref 0.2–1.2)
Total Protein: 6.8 g/dL (ref 6.1–8.1)

## 2016-06-05 LAB — CBC WITH DIFFERENTIAL/PLATELET
Basophils Absolute: 60 cells/uL (ref 0–200)
Basophils Relative: 1 %
Eosinophils Absolute: 60 cells/uL (ref 15–500)
Eosinophils Relative: 1 %
HCT: 44.1 % (ref 38.5–50.0)
Hemoglobin: 14.3 g/dL (ref 13.2–17.1)
Lymphocytes Relative: 20 %
Lymphs Abs: 1200 cells/uL (ref 850–3900)
MCH: 29.5 pg (ref 27.0–33.0)
MCHC: 32.4 g/dL (ref 32.0–36.0)
MCV: 91.1 fL (ref 80.0–100.0)
MPV: 10.6 fL (ref 7.5–12.5)
Monocytes Absolute: 600 cells/uL (ref 200–950)
Monocytes Relative: 10 %
Neutro Abs: 4080 cells/uL (ref 1500–7800)
Neutrophils Relative %: 68 %
Platelets: 521 10*3/uL — ABNORMAL HIGH (ref 140–400)
RBC: 4.84 MIL/uL (ref 4.20–5.80)
RDW: 14.4 % (ref 11.0–15.0)
WBC: 6 10*3/uL (ref 3.8–10.8)

## 2016-06-06 ENCOUNTER — Telehealth: Payer: Self-pay | Admitting: Gastroenterology

## 2016-06-06 LAB — HEPATITIS B SURFACE ANTIBODY,QUALITATIVE: Hep B S Ab: NEGATIVE

## 2016-06-06 LAB — HEPATITIS A ANTIBODY, TOTAL: Hep A Total Ab: REACTIVE — AB

## 2016-06-06 NOTE — Telephone Encounter (Signed)
PLEASE CALL PT'S MOTHER. HIS LABS ARE NORMAL. He is IMMUNE TO HEP A BUT NOT HEP B. HE SHOULD RECEIVE THE HEP B VACCINE.

## 2016-06-08 NOTE — Telephone Encounter (Signed)
Called. Many rings and no answer.  

## 2016-06-09 NOTE — Telephone Encounter (Signed)
PT's mom called back and said pt got his first of the series of Hep B at [facility] today.

## 2016-06-09 NOTE — Telephone Encounter (Signed)
Pt's mom is aware. Pt does not have a PCP at this time. Rx for the Hep B written and she will pick up and have done at pharmacy.

## 2016-06-09 NOTE — Telephone Encounter (Signed)
REVIEWED-NO ADDITIONAL RECOMMENDATIONS. 

## 2016-06-15 ENCOUNTER — Telehealth: Payer: Self-pay | Admitting: Gastroenterology

## 2016-06-15 MED ORDER — FOLIC ACID 1 MG PO TABS: 1.0000 mg | ORAL_TABLET | Freq: Every day | ORAL | 3 refills | Status: DC

## 2016-06-15 MED ORDER — MERCAPTOPURINE 50 MG PO TABS: 50.0000 mg | ORAL_TABLET | Freq: Every day | ORAL | 3 refills | Status: DC

## 2016-06-15 NOTE — Addendum Note (Signed)
Addended by: Mahala Menghini on: 06/15/2016 07:21 PM   Modules accepted: Orders

## 2016-06-15 NOTE — Telephone Encounter (Signed)
rx done

## 2016-06-15 NOTE — Telephone Encounter (Signed)
Forwarding to refill box.

## 2016-06-15 NOTE — Telephone Encounter (Signed)
Tanner Bradley's mother, Loletha Carrow, called to say that he would be needed refills on Folic Acid and Mercaptopurine. He uses [facility] in [place] on [address]

## 2016-07-30 ENCOUNTER — Encounter: Payer: Self-pay | Admitting: Gastroenterology

## 2016-07-30 ENCOUNTER — Ambulatory Visit (INDEPENDENT_AMBULATORY_CARE_PROVIDER_SITE_OTHER): Payer: Medicaid Other | Admitting: Gastroenterology

## 2016-07-30 DIAGNOSIS — K5010 Crohn's disease of large intestine without complications: Secondary | ICD-10-CM

## 2016-07-30 MED ORDER — FOLIC ACID 1 MG PO TABS: 1.0000 mg | ORAL_TABLET | Freq: Every day | ORAL | 3 refills | Status: DC

## 2016-07-30 MED ORDER — MERCAPTOPURINE 50 MG PO TABS: 50.0000 mg | ORAL_TABLET | Freq: Every day | ORAL | 3 refills | Status: DC

## 2016-07-30 NOTE — Patient Instructions (Addendum)
I REFILLED YOUR MEDS.  COMPLETE HEP B VACCINE.  FOLLOW UP IN 6 MOS.

## 2016-07-30 NOTE — Progress Notes (Signed)
ON RECALL  °

## 2016-07-30 NOTE — Progress Notes (Signed)
cc'ed to pcp °

## 2016-07-30 NOTE — Assessment & Plan Note (Signed)
.  SYMPTOMS CONTROLLED/RESOLVED. WEIGHT STABLE.  I REFILLED YOUR MEDS FOR ONE YEAR. COMPLETE HEP B VACCINE. FOLLOW UP IN 6 MOS. CONSIDER HFP/CBC MAR 2019.

## 2016-07-30 NOTE — Progress Notes (Signed)
   Subjective:    Patient ID: LEV CERVONE, male    DOB: 04-07-1990, 26 y.o.   MRN: [id number]  [name], MD  HPI BOWELS DOING GOOD. CAN EAT PIZZA AND DOESN'T BOTHER HIM. IF TO MANY GRAPES, LOOSE STOOLS. APPETITE: GOOD. BEEN GOING ON TRIPS. WEIGHT STAY 140-143 LBS. BMs: EVERY DAY. NO NOCTUIRNAL STOOLS.  EATING MORE FRUIT. STAYING AWAY FROM HONEY BUNS. COUGH PILLS HELPED/ HEPATITIS B SHOT x2, 3RD ROUND DUE IN OCT 2018.   PT DENIES FEVER, CHILLS, HEMATOCHEZIA, HEMATEMESIS, nausea, vomiting, melena, diarrhea, CHEST PAIN, SHORTNESS OF BREATH, CHANGE IN BOWEL IN HABITS, constipation, abdominal pain, problems swallowing, OR heartburn or indigestion.  Past Medical History:  Diagnosis Date  . C. difficile colitis NOV 2013  . Cerebral palsy ([place])   . Crohn's disease ([place]) 2008 TCS POS SBFT NL   Rx: Prednisone then 6-MP   Past Surgical History:  Procedure Laterality Date  . COLONOSCOPY  2008   COLITIS, bX: POS GRANULOMA-->CD   Allergies  Allergen Reactions  . Other Other (See Comments)    Certain antibiotics CAN NOT be taken due to Crohn's disease   . Amoxicillin Rash   Current Outpatient Prescriptions  Medication Sig Dispense Refill  . acetaminophen (TYLENOL) 500 MG tablet Take 500 mg by mouth as needed for pain.    . calcium citrate (CALCITRATE - DOSED IN MG ELEMENTAL CALCIUM) 950 MG tablet Take 2 tablets by mouth daily.    . cholecalciferol (VITAMIN D) 1000 units tablet Take 1,000 Units by mouth daily.    . folic acid (FOLVITE) 1 MG tablet Take 1 tablet (1 mg total) by mouth daily.    . mercaptopurine (PURINETHOL) 50 MG tablet Take 1 tablet (50 mg total) by mouth daily. Give on an empty stomach 1 hour before or 2 hours after meals. Caution: Chemotherapy.    . polyethylene glycol powder (GLYCOLAX/MIRALAX) powder Take 17 g by mouth daily. (Patient taking differently: Take 17 g by mouth as needed. )    . Probiotic CAPS 1 PO DAILY.    Marland Kitchen Wheat Dextrin (BENEFIBER) POWD 1 SCOOP PO TID  (Patient taking differently: as needed. 1 SCOOP PO TID) prn   . budesonide-formoterol (SYMBICORT) 80-4.5 MCG/ACT inhaler Inhale 2 puffs into the lungs 2 (two) times daily. FOR 7 DAYS THE 2 PUFFS DAILY FOR 7 DAYS (Patient not taking: Reported on 12/11/2015)    .       Review of Systems PER HPI OTHERWISE ALL SYSTEMS ARE NEGATIVE.    Objective:   Physical Exam  Constitutional: He is oriented to person, place, and time. He appears well-developed and well-nourished. No distress.  HENT:  Head: Normocephalic and atraumatic.  Mouth/Throat: Oropharynx is clear and moist. No oropharyngeal exudate.  Eyes: Pupils are equal, round, and reactive to light. No scleral icterus.  Neck: Normal range of motion. Neck supple.  Cardiovascular: Normal rate, regular rhythm and normal heart sounds.   Pulmonary/Chest: Effort normal and breath sounds normal. No respiratory distress.  Abdominal: Soft. Bowel sounds are normal. He exhibits no distension. There is no tenderness.  Musculoskeletal: He exhibits no edema.  Lymphadenopathy:    He has no cervical adenopathy.  Neurological: He is alert and oriented to person, place, and time.  Psychiatric: He has a normal mood and affect.  Vitals reviewed.     Assessment & Plan:

## 2016-08-07 ENCOUNTER — Ambulatory Visit: Payer: Medicaid Other | Admitting: "Endocrinology

## 2016-08-12 ENCOUNTER — Ambulatory Visit: Payer: Medicaid Other | Admitting: "Endocrinology

## 2016-08-12 ENCOUNTER — Other Ambulatory Visit: Payer: Self-pay | Admitting: "Endocrinology

## 2016-08-12 DIAGNOSIS — E559 Vitamin D deficiency, unspecified: Secondary | ICD-10-CM

## 2016-08-12 DIAGNOSIS — M8580 Other specified disorders of bone density and structure, unspecified site: Secondary | ICD-10-CM

## 2016-08-12 DIAGNOSIS — T380X5A Adverse effect of glucocorticoids and synthetic analogues, initial encounter: Secondary | ICD-10-CM

## 2016-08-12 LAB — COMPREHENSIVE METABOLIC PANEL
ALT: 10 U/L (ref 9–46)
AST: 14 U/L (ref 10–40)
Albumin: 3.8 g/dL (ref 3.6–5.1)
Alkaline Phosphatase: 78 U/L (ref 40–115)
BUN: 9 mg/dL (ref 7–25)
CO2: 27 mmol/L (ref 20–31)
Calcium: 8.8 mg/dL (ref 8.6–10.3)
Chloride: 103 mmol/L (ref 98–110)
Creat: 0.8 mg/dL (ref 0.60–1.35)
Glucose, Bld: 116 mg/dL — ABNORMAL HIGH (ref 65–99)
Potassium: 4.3 mmol/L (ref 3.5–5.3)
Sodium: 141 mmol/L (ref 135–146)
Total Bilirubin: 0.2 mg/dL (ref 0.2–1.2)
Total Protein: 6.8 g/dL (ref 6.1–8.1)

## 2016-08-13 LAB — VITAMIN D 25 HYDROXY (VIT D DEFICIENCY, FRACTURES): Vit D, 25-Hydroxy: 33 ng/mL (ref 30–100)

## 2016-09-14 ENCOUNTER — Ambulatory Visit (INDEPENDENT_AMBULATORY_CARE_PROVIDER_SITE_OTHER): Payer: Medicaid Other | Admitting: "Endocrinology

## 2016-09-14 ENCOUNTER — Encounter: Payer: Self-pay | Admitting: "Endocrinology

## 2016-09-14 VITALS — BP 129/90 | HR 102 | Ht 67.0 in | Wt 140.6 lb

## 2016-09-14 DIAGNOSIS — E559 Vitamin D deficiency, unspecified: Secondary | ICD-10-CM | POA: Diagnosis not present

## 2016-09-14 DIAGNOSIS — T380X5A Adverse effect of glucocorticoids and synthetic analogues, initial encounter: Secondary | ICD-10-CM

## 2016-09-14 DIAGNOSIS — M8580 Other specified disorders of bone density and structure, unspecified site: Secondary | ICD-10-CM

## 2016-09-14 NOTE — Progress Notes (Signed)
Subjective:    Patient ID: Tanner Bradley, male    DOB: [birth year], PCP [name], MD   Past Medical History:  Diagnosis Date  . C. difficile colitis NOV 2013  . Cerebral palsy ([place])   . Crohn's disease ([place]) 2008 TCS POS SBFT NL   Rx: Prednisone then 6-MP   Past Surgical History:  Procedure Laterality Date  . COLONOSCOPY  2008   COLITIS, bX: POS GRANULOMA-->CD   Social History   Social History  . Marital status: Single    Spouse name: N/A  . Number of children: N/A  . Years of education: N/A   Social History Main Topics  . Smoking status: Never Smoker  . Smokeless tobacco: Never Used  . Alcohol use No  . Drug use: No  . Sexual activity: Not Asked   Other Topics Concern  . None   Social History Narrative   WAS HOME SCHOOLED. HAS ONE SISTER.   MOM DOESN'T DRIVE.   Outpatient Encounter Prescriptions as of 09/14/2016  Medication Sig  . acetaminophen (TYLENOL) 500 MG tablet Take 500 mg by mouth as needed for pain.  . budesonide-formoterol (SYMBICORT) 80-4.5 MCG/ACT inhaler Inhale 2 puffs into the lungs 2 (two) times daily. FOR 7 DAYS THE 2 PUFFS DAILY FOR 7 DAYS (Patient not taking: Reported on 12/11/2015)  . calcium citrate (CALCITRATE - DOSED IN MG ELEMENTAL CALCIUM) 950 MG tablet Take 2 tablets by mouth daily.  . cholecalciferol (VITAMIN D) 1000 units tablet Take 2,000 Units by mouth daily.  . folic acid (FOLVITE) 1 MG tablet Take 1 tablet (1 mg total) by mouth daily.  . mercaptopurine (PURINETHOL) 50 MG tablet Take 1 tablet (50 mg total) by mouth daily. Give on an empty stomach 1 hour before or 2 hours after meals. Caution: Chemotherapy.  . polyethylene glycol powder (GLYCOLAX/MIRALAX) powder Take 17 g by mouth daily. (Patient taking differently: Take 17 g by mouth as needed. )  . Probiotic CAPS 1 PO DAILY.  Marland Kitchen Wheat Dextrin (BENEFIBER) POWD 1 SCOOP PO TID (Patient taking differently: as needed. 1 SCOOP PO TID)  . [DISCONTINUED] Vitamin D, Ergocalciferol,  (DRISDOL) 50000 units CAPS capsule Take 1 capsule (50,000 Units total) by mouth every 7 (seven) days. (Patient not taking: Reported on 07/30/2016)  . [DISCONTINUED] cholecalciferol (VITAMIN D) tablet 1,000 Units    No facility-administered encounter medications on file as of 09/14/2016.    ALLERGIES: Allergies  Allergen Reactions  . Other Other (See Comments)    Certain antibiotics CAN NOT be taken due to Crohn's disease   . Amoxicillin Rash   VACCINATION STATUS:  There is no immunization history on file for this patient.  HPI  26 year old male patient with medical hx significant for Crohn's disease and cerebral palsy. He is here to f/u after being seen in consultation for osteopenia. He is accompanied by his mother ,  He lost 10 pounds since last visit. This is due to modification in his diet, trying to avoid processed carbohydrates.  he is on therapy with  vitamin D and calcium  no new complaints.  His previous work up did not show any specific endocrine cause for osteopenia but possibly multifactorial. Pt with remarkable hx for recurrent high dose steroids therapy in the past for Crohn's flare up, currently on Mercaptopurine. He has had malabsorption. No personal hx of fractures.  He denies heat intolerance but has hx of anxiety and tremors. For seizure disorder he was treated with phenobarbital in the  past. Never smoked. No family hx of osteoporosis. 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    Review of Systems  Constitutional: + stable weight,  no fatigue, no subjective hyperthermia/hypothermia Eyes: no blurry vision, no xerophthalmia ENT: no sore throat, no nodules palpated in throat, no dysphagia/odynophagia, no hoarseness Cardiovascular: no CP/SOB/palpitations/leg swelling Respiratory: no cough/SOB Gastrointestinal: no N/V/D/C Musculoskeletal: no muscle/joint aches Skin: no rashes Neurological: no  tremors/numbness/tingling/dizziness Psychiatric: no depression/anxiety  Objective:    BP 129/90   Pulse (!) 102   Ht 5' 7"  (1.702 m)   Wt 140 lb 9.6 oz (63.8 kg)   BMI 22.02 kg/m   Wt Readings from Last 3 Encounters:  09/14/16 140 lb 9.6 oz (63.8 kg)  07/30/16 140 lb 6.4 oz (63.7 kg)  12/11/15 145 lb 9.6 oz (66 kg)    Physical Exam Constitutional: in NAD Eyes: PERRLA, EOMI, no exophthalmos ENT: moist mucous membranes, no thyromegaly, no cervical lymphadenopathy Cardiovascular: RRR, No MRG Respiratory: CTA B Gastrointestinal: abdomen soft, NT, ND, BS+ Musculoskeletal: no deformities, strength intact in all 4 Skin: moist, warm, no rashes Neurological: no tremor with outstretched hands, DTR normal in all 4  Results for orders placed or performed in visit on 08/12/16  Comprehensive metabolic panel  Result Value Ref Range   Sodium 141 135 - 146 mmol/L   Potassium 4.3 3.5 - 5.3 mmol/L   Chloride 103 98 - 110 mmol/L   CO2 27 20 - 31 mmol/L   Glucose, Bld 116 (H) 65 - 99 mg/dL   BUN 9 7 - 25 mg/dL   Creat 0.80 0.60 - 1.35 mg/dL   Total Bilirubin 0.2 0.2 - 1.2 mg/dL   Alkaline Phosphatase 78 40 - 115 U/L   AST 14 10 - 40 U/L   ALT 10 9 - 46 U/L   Total Protein 6.8 6.1 - 8.1 g/dL   Albumin 3.8 3.6 - 5.1 g/dL   Calcium 8.8 8.6 - 10.3 mg/dL  Vitamin D, 25-hydroxy  Result Value Ref Range   Vit D, 25-Hydroxy 33 30 - 100 ng/mL   Complete Blood Count (Most recent): Lab Results  Component Value Date   WBC 6.0 06/05/2016   HGB 14.3 06/05/2016   HCT 44.1 06/05/2016   MCV 91.1 06/05/2016   PLT 521 (H) 06/05/2016   Chemistry (most recent): Lab Results  Component Value Date   NA 141 08/12/2016   K 4.3 08/12/2016   CL 103 08/12/2016   CO2 27 08/12/2016   BUN 9 08/12/2016   CREATININE 0.80 08/12/2016    Assessment & Plan:   1. Decreased bone mass  His labs showed  vitamin D better at 33. -He is DEXA scan from April 2017 showed that he has gained some bone mass on the  spine, no significant change in the hips. His DXA (from April 2015) was done at a different machine and facility , last one at [facility] .  His age matched Z-Score is better this time compared to 2013. Pt's prior work up rules out thyroid dysfunction, hypogonadism, and hyperparathyroidism. He has normal hepatorenal function, and no diabetes. 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 Patient's current Z-Score is still  below the expected age range,  the diagnosis of Osteoporosis is not made based on densitometric criteria alone in these age group.  Pt is off therapy with steroids, this will continue help reverse some more bone loss. I will  like to avoid bisphosphonate therapy in him because there is no strong data to support benefit from pharmacologic therapy in this age group.  2. Vitamin D deficiency  He is  vitamin D replete , but will continue to benefit from vitamin D 3, 2000 units daily.  I advised pt and his parents to continue optimal use of calcium and vitamin D, steady but cautions exercise programs which involve graduated weight bearing, and avoid risks of falls, and harsh sports and movements to minimize his chance of fractures.   He will RTN in 1 yr with some repeat of basic labs ( CMP, Vitamin D. -  He will have repeat DEXA scan in April 2019. - he is  IGF1  levels are within normal range.  - I advised patient to maintain close follow up with [name], MD for primary care needs.  Follow up plan: Return in about 1 year (around 09/14/2017) for follow up with Bone Density, follow up with pre-visit labs.  Glade Lloyd, MD Phone: [phone]  Fax: [phone]   09/14/2016, 12:19 PM

## 2016-12-09 ENCOUNTER — Encounter: Payer: Self-pay | Admitting: Gastroenterology

## 2016-12-14 ENCOUNTER — Telehealth: Payer: Self-pay | Admitting: Gastroenterology

## 2016-12-14 NOTE — Telephone Encounter (Signed)
Pt's mother, Loletha Carrow, called to make OV with SF in November and is aware of date and time. She asked if any labs were due and if so to let her know so they would be on hand the date of his appointment.

## 2016-12-14 NOTE — Telephone Encounter (Signed)
[name], please advise!

## 2016-12-16 NOTE — Telephone Encounter (Signed)
Pt and mom are aware.

## 2016-12-16 NOTE — Telephone Encounter (Signed)
REVIEWED. NO LABS NEEDED UNTIL JUN 2019.

## 2017-01-13 ENCOUNTER — Ambulatory Visit: Payer: Medicaid Other | Admitting: Gastroenterology

## 2017-01-13 ENCOUNTER — Encounter: Payer: Self-pay | Admitting: Gastroenterology

## 2017-01-13 DIAGNOSIS — K5010 Crohn's disease of large intestine without complications: Secondary | ICD-10-CM | POA: Diagnosis not present

## 2017-01-13 MED ORDER — MERCAPTOPURINE 50 MG PO TABS: 50.0000 mg | ORAL_TABLET | Freq: Every day | ORAL | 3 refills | Status: DC

## 2017-01-13 MED ORDER — FOLIC ACID 1 MG PO TABS: 1.0000 mg | ORAL_TABLET | Freq: Every day | ORAL | 3 refills | Status: DC

## 2017-01-13 NOTE — Progress Notes (Signed)
   Subjective:    Patient ID: Tanner Bradley, male    DOB: 1991/01/02, 26 y.o.   MRN: [id number]  [name], MD   HPI FeLl at [facility] 2008 AND HAD Td. OTHER IMMUNIZATIONS UP TO DATE. WHOLE FAMILY HAD VIRAL ILLNESS WHEN THE TEMP CHANGED. RX WITH LIQUID TYLENOL AND TYLENOL. TEMP 100F MAX. HAD COLD: SCRATCHY THROAT A LITTLE COUGH ND FEELING ICKY, & SLEEPY EYES. BMs: DOING-NL,  WAS EATING CHEESE CRACKERS AND PIZZA AND HELP RESOLVE WATERY STOOLS. BMs: EVERY DAY. SEE NIDA IN JUN 2019. HAVING BONE DENSITY TEST IN MAY 2019.  PT DENIES FEVER, CHILLS, HEMATOCHEZIA, HEMATEMESIS, nausea, vomiting, melena, diarrhea, CHEST PAIN, SHORTNESS OF BREATH,  CHANGE IN BOWEL IN HABITS, constipation, abdominal pain, problems swallowing, OR heartburn or indigestion.  Past Medical History:  Diagnosis Date  . C. difficile colitis NOV 2013  . Cerebral palsy ([place])   . Crohn's disease ([place]) 2008 TCS POS SBFT NL   Rx: Prednisone then 6-MP    Past Surgical History:  Procedure Laterality Date  . COLONOSCOPY  2008   COLITIS, bX: POS GRANULOMA-->CD   Allergies  Allergen Reactions  . Other Other (See Comments)    Certain antibiotics CAN NOT be taken due to Crohn's disease   . Amoxicillin Rash    Current Outpatient Medications  Medication Sig Dispense Refill  . acetaminophen  500 MG tablet Take 500 mg by mouth as needed for pain.    . budesonide-formoterol (SYMBICORT) 80-4.5 MCG/ACT inhaler Inhale 2 puffs into the lungs 2 (two) times daily. FOR 7 DAYS THE 2 PUFFS DAILY FOR 7 DAYS    . calcium citrate (CALCITRATE - DOSED IN MG ELEMENTAL CALCIUM) 950 MG tablet Take 2 tablets by mouth daily.    . cholecalciferol (VITAMIN D) 1000 units tablet Take 2,000 Units by mouth daily.    . folic acid (FOLVITE) 1 MG tablet Take 1 tablet (1 mg total) by mouth daily.    . mercaptopurine (PURINETHOL) 50 MG tablet Take 1 tablet (50 mg total) by mouth daily. Give on an empty stomach 1 hour before or 2 hours after meals.  Caution: Chemotherapy.    . polyethylene glycol powder (GLYCOLAX/MIRALAX) powder Take 17 g by mouth daily. (Patient taking differently: Take 17 g by mouth as needed. )    . Probiotic CAPS 1 PO DAILY.    Marland Kitchen Wheat Dextrin (BENEFIBER) POWD 1 SCOOP PO TID       Review of Systems PER HPI OTHERWISE ALL SYSTEMS ARE NEGATIVE.     Objective:   Physical Exam        Assessment & Plan:

## 2017-01-13 NOTE — Patient Instructions (Signed)
I REFILLED MERCAPTOPURINE AND FOLIC ACID FOR ONE YEAR.  YOU NEED THE TETANUS SHOT IN DEC 2018.  COMPLETE DEXA SCAN WITH DR. NIDA.  PLEASE CALL WITH QUESTIONS OR CONCERNS.  FOLLOW UP IN 6 MOS.   IF LABS ARE DRAWN PLEASE HAVE A COMPLETE BLOOD COUNT FEB 2019.

## 2017-01-13 NOTE — Assessment & Plan Note (Addendum)
SYMPTOMS FAIRLY WELL CONTROLLED AFTER WHAT APPEARS TO BE AN ACUTE VIRAL ILLNESS. CLINICALLY IMPROVING.  REFILL MERCAPTOPURINE AND FOLIC ACID. REVIEWED IMMUNIZATIONS. NEEDS Td IN DEC 2018. COMPLETE DEXA WITH DR. NIDA. CALL WITH QUESTIONS OR CONCERNS. FOLLOW UP IN 6 MOS.  IF LABS DRAWN NEEDS CBC AFTER MAR 2019.

## 2017-05-11 ENCOUNTER — Other Ambulatory Visit: Payer: Self-pay | Admitting: Gastroenterology

## 2017-05-11 LAB — CBC WITH DIFFERENTIAL/PLATELET
Basophils Absolute: 38 cells/uL (ref 0–200)
Basophils Relative: 0.7 %
Eosinophils Absolute: 167 cells/uL (ref 15–500)
Eosinophils Relative: 3.1 %
HCT: 41.1 % (ref 38.5–50.0)
Hemoglobin: 13.3 g/dL (ref 13.2–17.1)
Lymphs Abs: 1334 cells/uL (ref 850–3900)
MCH: 28.1 pg (ref 27.0–33.0)
MCHC: 32.4 g/dL (ref 32.0–36.0)
MCV: 86.7 fL (ref 80.0–100.0)
MPV: 10.6 fL (ref 7.5–12.5)
Monocytes Relative: 10.3 %
Neutro Abs: 3305 cells/uL (ref 1500–7800)
Neutrophils Relative %: 61.2 %
Platelets: 592 10*3/uL — ABNORMAL HIGH (ref 140–400)
RBC: 4.74 10*6/uL (ref 4.20–5.80)
RDW: 14 % (ref 11.0–15.0)
Total Lymphocyte: 24.7 %
WBC mixed population: 556 cells/uL (ref 200–950)
WBC: 5.4 10*3/uL (ref 3.8–10.8)

## 2017-05-12 ENCOUNTER — Telehealth: Payer: Self-pay

## 2017-05-12 ENCOUNTER — Other Ambulatory Visit: Payer: Self-pay | Admitting: "Endocrinology

## 2017-05-12 DIAGNOSIS — M859 Disorder of bone density and structure, unspecified: Secondary | ICD-10-CM

## 2017-05-12 DIAGNOSIS — M8580 Other specified disorders of bone density and structure, unspecified site: Secondary | ICD-10-CM

## 2017-05-12 NOTE — Telephone Encounter (Signed)
ATTEMPTED TO CALL DR. NIDA TO DISCUSS.

## 2017-05-12 NOTE — Telephone Encounter (Signed)
Pts mom Smitty Knudsen) called late afternoon3-5-19 concerning a Bone Density that the pt will need before his June 2019 appt. I told Mrs Tania Ade that we would get this done as the order needed to be updated and that this would be done and scheduled no later than tomorrow. Mrs Tania Ade seemed to voice understanding however she left several messages thoughout the morning while we were seeing pts. Called radiology at least 20 times according to scheduler. She also called the office here a numerous amount of times. We tried to explain that we were seeing pts. We tried to reinforce that the Bone Density would be scheduled and the order would be updated today. Pt continued to call office stating that the bone density had not been scheduled. After morning pts were gone [place] answered call from the mom. We did get the Bone Density scheduled at that time for 05-17-17.

## 2017-05-12 NOTE — Telephone Encounter (Signed)
Vickie called back and said she was able to talk to a couple of people about the patient experience. She just does not want to take the pt back to see Dr. Dorris Fetch. She is hoping that [name] can just see him and follow up on the Dexa that is planned for Monday. If she has to take pt to an Endocrinologist, she would want to be referred to [facility]. But she is hoping that pt has improved enough to not need Endocrinologist. She would like a phone call from [name] to discuss her concerns.

## 2017-05-12 NOTE — Telephone Encounter (Addendum)
Pt's mom, Vickie, called to express her frustration with Dr. Liliane Channel office and Radiology at the hospital. She wants to know how much longer [name] thinks pt should see Dr. Dorris Fetch. She was trying yesterday to get things scheduled as needed and she said the personnel at both places were very short talking to her. She asked who she should call. Per Almyra Free, she can call [facility] [phone] and ask for the Patient Experience Office.   Vickie is aware of the phone number to call. She is hoping that [name] will say that the pt is doing well enough now that he will not need to be seen there again. He is just on list to be seen yearly now.  [name], please advise!

## 2017-05-13 ENCOUNTER — Telehealth: Payer: Self-pay | Admitting: Gastroenterology

## 2017-05-13 ENCOUNTER — Other Ambulatory Visit: Payer: Self-pay | Admitting: "Endocrinology

## 2017-05-13 ENCOUNTER — Other Ambulatory Visit: Payer: Self-pay

## 2017-05-13 ENCOUNTER — Encounter: Payer: Self-pay | Admitting: Gastroenterology

## 2017-05-13 DIAGNOSIS — T380X5A Adverse effect of glucocorticoids and synthetic analogues, initial encounter: Principal | ICD-10-CM

## 2017-05-13 DIAGNOSIS — M8580 Other specified disorders of bone density and structure, unspecified site: Secondary | ICD-10-CM

## 2017-05-13 DIAGNOSIS — K5010 Crohn's disease of large intestine without complications: Secondary | ICD-10-CM

## 2017-05-13 NOTE — Telephone Encounter (Signed)
Referral faxed to Dr. Almetta Lovely office. Called and informed pt's mother of referral and Dexa scan ordered by [name]. She was very Patent attorney.

## 2017-05-13 NOTE — Telephone Encounter (Signed)
Dexa order entered as [name] ordering provider. Called [facility]. They will attach previous appt to the new order.

## 2017-05-13 NOTE — Telephone Encounter (Addendum)
REFER PT TO ENDOCRINOLOGY DR. BALAN IN [place], Dx: OSTEOPOROSIS. ORDER FOR DEXA SCAN ENTERED.

## 2017-05-13 NOTE — Telephone Encounter (Signed)
Mother has called again. She wants SF nurse to call her when patient's results from his labs he had done Tuesday and the bone density test that is scheduled for 3/11 to her. She doesn't want to wait until his OV in May to get them. I told her if it was something SF has ordered she would get the results when they were available.

## 2017-05-13 NOTE — Telephone Encounter (Signed)
[phone] patient mother would like for you to call her if you can, she has a few questions about her son's endocrinologist

## 2017-05-13 NOTE — Telephone Encounter (Signed)
Pt's mother, Loletha Carrow, said she just spoke to AM in DS absence and she has another question. Please call her back at [phone]

## 2017-05-13 NOTE — Telephone Encounter (Signed)
Pts mother was notified. Pt will need a referral. Pts last apt with an endocrinologist was July 2018.

## 2017-05-13 NOTE — Telephone Encounter (Signed)
Referral faxed to Dr. Almetta Lovely office per [facility]F. Pt's mother is aware and very appreciative.

## 2017-05-13 NOTE — Telephone Encounter (Signed)
PLEASE CALL PT. I SPOKE WITH DR.NIDA. HE SHOULD COMPLETE THE DEXA BUT NEEDS TO SEE AN ENDOCRINOLOGIST AT LEAST ONCE A YEAR.

## 2017-05-13 NOTE — Telephone Encounter (Addendum)
PT HAD QUESTIONS ABOUT REFERRAL. NURSE(KIM) WAS RUDE. DEXA SCAN IS ORDERED. WILL CHANGE ORDER TO TO REFLECT DR. Gina Costilla AS ORDERING PHYSICIAN.

## 2017-05-13 NOTE — Telephone Encounter (Signed)
Tried calling pts mother twice. No VM was set up. Will try to call again.

## 2017-05-14 ENCOUNTER — Telehealth: Payer: Self-pay

## 2017-05-14 NOTE — Telephone Encounter (Signed)
Penny at Dr. Almetta Lovely office called. Their fax machine is messed up. Referral refaxed to [phone].

## 2017-05-14 NOTE — Telephone Encounter (Signed)
Mother - Heloise Purpura called me this pm.  She called Radiology at [facility] today and they said her son should not take Vit D and Calcium for 48 hours.  She said Dr Liliane Channel office did not tell her that and she was told to call Dr Liliane Channel office to confirm.  She called me because she could not get in touch with them this pm since they close at 1 on Friday.  I called Dr Dorris Fetch and sent him a text.  I did not get a answer so I asked Dr Lysle Morales at [facility] would it be ok if he did not take the med for 48 hours prior to the test.  She said it would be ok to DC the 2 meds for 48 hours.  This is normal for this type of test.

## 2017-05-14 NOTE — Telephone Encounter (Signed)
05/12/17 I called Heloise Purpura (Pt mother) late 05/12/17 and talked with her about 45 min in great detail regarding the care she received for her son at [place].  I have given her my direct phone number here at [facility] and she will call me directly for any needs she has at this point.  Just as a good jester I went by [employer] and got her a 74.08 gift certificate for her and her son to go out for lunch on the next time her have to do to the doctor.  I am mailing that today with a card again letting her know how sorry I was she did not have a good experience at Dr. Liliane Channel office.  I think this will show we are very concerned about her and her son.  I will follow up with her in a few weeks to find out the results of his Bone Density.  Again putting words into action that we care and we are with them. Our prayers are that the upcoming test will be a good experience at the Bone Density office and the testing will show good results.

## 2017-05-14 NOTE — Telephone Encounter (Signed)
Pts mother was taken care of. Mother called back to thank our office for everything per Tretha Sciara.

## 2017-05-17 ENCOUNTER — Ambulatory Visit (HOSPITAL_COMMUNITY)
Admission: RE | Admit: 2017-05-17 | Discharge: 2017-05-17 | Disposition: A | Discharge status: Home / Self Care | Payer: Medicaid Other | Source: Ambulatory Visit | Attending: Gastroenterology | Admitting: Gastroenterology

## 2017-05-17 ENCOUNTER — Other Ambulatory Visit (HOSPITAL_COMMUNITY): Payer: Medicaid Other

## 2017-05-17 DIAGNOSIS — M8580 Other specified disorders of bone density and structure, unspecified site: Secondary | ICD-10-CM | POA: Diagnosis not present

## 2017-05-17 DIAGNOSIS — T380X5A Adverse effect of glucocorticoids and synthetic analogues, initial encounter: Secondary | ICD-10-CM | POA: Diagnosis not present

## 2017-05-17 NOTE — Progress Notes (Signed)
CC'D TO PCP °

## 2017-05-17 NOTE — Telephone Encounter (Signed)
done

## 2017-05-18 NOTE — Progress Notes (Signed)
Pt's mom is aware.

## 2017-05-19 ENCOUNTER — Telehealth: Payer: Self-pay

## 2017-05-19 DIAGNOSIS — K5010 Crohn's disease of large intestine without complications: Secondary | ICD-10-CM

## 2017-05-19 DIAGNOSIS — M8580 Other specified disorders of bone density and structure, unspecified site: Secondary | ICD-10-CM

## 2017-05-19 DIAGNOSIS — T380X5A Adverse effect of glucocorticoids and synthetic analogues, initial encounter: Secondary | ICD-10-CM

## 2017-05-19 NOTE — Telephone Encounter (Signed)
Pt's mom called and said Dr. Almetta Lovely office does not accept Medicaid. She would like pt referred to [facility] End[facility] and she has checked and they accept medicaid.  They have 3 doctors for Endocrinology and pt prefers a male if possible but will see any one. The male doctor listed is Philemon Kingdom.

## 2017-05-19 NOTE — Telephone Encounter (Signed)
Referral has been sent to Dr. Arman Filter office.

## 2017-05-21 NOTE — Telephone Encounter (Signed)
REFERRAL PT TO [facility] DR. GHERGHE, DX: OSTEOPENIA.

## 2017-05-21 NOTE — Telephone Encounter (Signed)
Referral done 05/19/17.

## 2017-05-25 NOTE — Telephone Encounter (Signed)
REVIEWED-NO ADDITIONAL RECOMMENDATIONS. 

## 2017-05-25 NOTE — Telephone Encounter (Signed)
Pt's mom left Vm that he has his first appt with Dr. Cruzita Lederer on 07/16/2017. She said to tell [name] she is very appreciative for helping them get things worked out.

## 2017-07-14 ENCOUNTER — Ambulatory Visit: Payer: Medicaid Other | Admitting: Gastroenterology

## 2017-07-14 ENCOUNTER — Encounter: Payer: Self-pay | Admitting: Gastroenterology

## 2017-07-14 DIAGNOSIS — D649 Anemia, unspecified: Secondary | ICD-10-CM

## 2017-07-14 DIAGNOSIS — M8580 Other specified disorders of bone density and structure, unspecified site: Secondary | ICD-10-CM | POA: Diagnosis not present

## 2017-07-14 DIAGNOSIS — T380X5A Adverse effect of glucocorticoids and synthetic analogues, initial encounter: Secondary | ICD-10-CM | POA: Diagnosis not present

## 2017-07-14 DIAGNOSIS — K50118 Crohn's disease of large intestine with other complication: Secondary | ICD-10-CM

## 2017-07-14 DIAGNOSIS — K5901 Slow transit constipation: Secondary | ICD-10-CM

## 2017-07-14 MED ORDER — BENZONATATE 100 MG PO CAPS: ORAL_CAPSULE | ORAL | 1 refills | Status: DC

## 2017-07-14 NOTE — Assessment & Plan Note (Signed)
DEXA UP TO DATE.  REVIEWED DEXA SCAN MAR 2019. CONTINUE CALCIUM WITH VITAMIN D.  SEE DR. GHERGHE FOR MANAGEMENT.  FOLLOW UP IN 6 MOS.

## 2017-07-14 NOTE — Assessment & Plan Note (Signed)
SYMPTOMS CONTROLLED/RESOLVED.  MIRALAX PRN. BENEFIBER PRN. FOLLOW UP IN 6 MOS.

## 2017-07-14 NOTE — Assessment & Plan Note (Signed)
WEIGHT UP 7 LBS. SYMPTOMS CONTROLLED/RESOLVED. COMPLICATED BY OSTEOPENIA.  TAKE TESSALON PERLES ONE THREE TIMES A DAY TO CONTROL HIS COUGH. CONTINUE CALCIUM WITH VITAMIN D.  CONTINUE MERCAPTOPURINE.  FOLLOW UP IN 6 MOS.

## 2017-07-14 NOTE — Progress Notes (Signed)
ON RECALL  °

## 2017-07-14 NOTE — Assessment & Plan Note (Signed)
SYMPTOMS CONTROLLED/RESOLVED.  NO BRBPR OR MELENA.  CONTINUE TO MONITOR SYMPTOMS.

## 2017-07-14 NOTE — Progress Notes (Signed)
cc'd to pcp 

## 2017-07-14 NOTE — Progress Notes (Signed)
   Subjective:    Patient ID: Tanner Bradley, male    DOB: 1990/07/09, 27 y.o.   MRN: [id number]  Tanner Percy, MD   HPI GAINED WEIGHT. DOING GREAT. EATING HEALTHY. GOAL WEIGHT: 150 LBS.  PT DENIES FEVER, CHILLS, HEMATOCHEZIA, HEMATEMESIS, nausea, vomiting, melena, diarrhea, CHEST PAIN, SHORTNESS OF BREATH,  CHANGE IN BOWEL IN HABITS, constipation, abdominal pain, problems swallowing, problems with sedation, heartburn or indigestion.  Past Medical History:  Diagnosis Date  . C. difficile colitis NOV 2013  . Cerebral palsy ([place])   . Crohn's disease ([place]) 2008 TCS POS SBFT NL   Rx: Prednisone then 6-MP   Past Surgical History:  Procedure Laterality Date  . COLONOSCOPY  2008   COLITIS, bX: POS GRANULOMA-->CD   Allergies  Allergen Reactions  . Other Other (See Comments)    Certain antibiotics CAN NOT be taken due to Crohn's disease   . Amoxicillin Rash    Current Outpatient Medications  Medication Sig    . acetaminophen (TYLENOL) 500 MG tablet Take 500 mg by mouth as needed for pain.    . budesonide-formoterol (SYMBICORT) 80-4.5 MCG/ACT inhaler Inhale 2 puffs into the lungs as needed. FOR 7 DAYS THE 2 PUFFS DAILY FOR 7 DAYS)    . calcium citrate (CALCITRATE - DOSED IN MG ELEMENTAL CALCIUM) 950 MG tablet Take 2 tablets by mouth daily.    . cholecalciferol (VITAMIN D) 1000 units tablet Take 2,000 Units by mouth daily.    . folic acid (FOLVITE) 1 MG tablet Take 1 tablet (1 mg total) daily by mouth.    . mercaptopurine (PURINETHOL) 50 MG tablet Take 1 tablet (50 mg total) daily by mouth. Give on an empty stomach 1 hour before or 2 hours after meals. Caution: Chemotherapy.    . polyethylene glycol powder (GLYCOLAX/MIRALAX) powder Take 17 g by mouth daily as needed. )    . Probiotic CAPS 1 PO DAILY.    Marland Kitchen Wheat Dextrin (BENEFIBER) POWD 1 SCOOP PO TID as needed.      Review of Systems PER HPI OTHERWISE ALL SYSTEMS ARE NEGATIVE.    Objective:   Physical Exam  Constitutional: He is  oriented to person, place, and time. He appears well-developed and well-nourished. No distress.  HENT:  Head: Normocephalic and atraumatic.  Mouth/Throat: Oropharynx is clear and moist. No oropharyngeal exudate.  Eyes: Pupils are equal, round, and reactive to light. No scleral icterus.  Neck: Normal range of motion. Neck supple.  Cardiovascular: Normal rate, regular rhythm and normal heart sounds.  Pulmonary/Chest: Effort normal and breath sounds normal. No respiratory distress.  Abdominal: Soft. Bowel sounds are normal. He exhibits no distension. There is no tenderness.  Musculoskeletal: He exhibits no edema.  Lymphadenopathy:    He has no cervical adenopathy.  Neurological: He is alert and oriented to person, place, and time.  Psychiatric: He has a normal mood and affect.  Vitals reviewed.     Assessment & Plan:

## 2017-07-14 NOTE — Patient Instructions (Addendum)
TAKE TESSALON PERLES ONE THREE TIMES A DAY TO CONTROL HIS COUGH.  CONTINUE CALCIUM WITH VITAMIN D.   CONTINUE MERCAPTOPURINE.  FOLLOW UP IN 6 MOS.

## 2017-07-16 ENCOUNTER — Ambulatory Visit: Payer: Medicaid Other | Admitting: Internal Medicine

## 2017-07-16 ENCOUNTER — Encounter: Payer: Self-pay | Admitting: Internal Medicine

## 2017-07-16 VITALS — BP 110/78 | HR 120 | Ht 65.0 in | Wt 147.0 lb

## 2017-07-16 DIAGNOSIS — M8580 Other specified disorders of bone density and structure, unspecified site: Secondary | ICD-10-CM | POA: Diagnosis not present

## 2017-07-16 DIAGNOSIS — T380X5A Adverse effect of glucocorticoids and synthetic analogues, initial encounter: Secondary | ICD-10-CM | POA: Diagnosis not present

## 2017-07-16 DIAGNOSIS — E559 Vitamin D deficiency, unspecified: Secondary | ICD-10-CM

## 2017-07-16 LAB — BASIC METABOLIC PANEL WITH GFR
BUN: 8 mg/dL (ref 7–25)
CO2: 26 mmol/L (ref 20–32)
Calcium: 9.4 mg/dL (ref 8.6–10.3)
Chloride: 106 mmol/L (ref 98–110)
Creat: 0.78 mg/dL (ref 0.60–1.35)
GFR, Est African American: 143 mL/min/{1.73_m2} (ref >=60)
GFR, Est Non African American: 124 mL/min/{1.73_m2} (ref >=60)
Glucose, Bld: 103 mg/dL — ABNORMAL HIGH (ref 65–99)
Potassium: 4.3 mmol/L (ref 3.5–5.3)
Sodium: 140 mmol/L (ref 135–146)

## 2017-07-16 LAB — PHOSPHORUS: Phosphorus: 3 mg/dL (ref 2.3–4.6)

## 2017-07-16 LAB — TSH: TSH: 3.23 u[IU]/mL (ref 0.35–4.50)

## 2017-07-16 LAB — VITAMIN D 25 HYDROXY (VIT D DEFICIENCY, FRACTURES): VITD: 18.66 ng/mL — ABNORMAL LOW (ref 30.00–100.00)

## 2017-07-16 NOTE — Progress Notes (Addendum)
Patient ID: Tanner Bradley, male   DOB: [birth year], 27 y.o.   MRN: [id number]    HPI  Tanner Bradley is a 27 y.o.-year-old male, referred by his gastroenterologist, Dr. 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  He is here with his mother who offers most of the history, including his past medical history and current medications. He previously saw Dr. Dorris Fetch. I reviewed his notes.  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 At that time he was on steroids for Crohn's ds, which was dx'ed in 2008. He continued on steroids for >2 years. No Crohn's flare ups since stopping steroids (on Mercaptopurine).  He also has cerebral palsy. He was on Phenobarbital as an infant for seizures. He then had to be on steroids for few years as a child.  I reviewed pt's DXA scans: Date L1-L4 Z score FN Z score  [place] (05/17/2017)  -0.6 R: -1.6 L: -1.9  [place] (06/26/2015)  -0.5 R: -1.7 L: -1.9  [place] (06/23/2013)  -0.4 R: -1.0 L: -1.2  [place] (09/21/2011)  -1.4 R: -2.1 L: -1.6   He denies fractures. No dizziness/vertigo/orthostasis/poor vision. No falls.  No previous OP treatments.  + h/o vitamin D deficiency. Reviewed available vit D levels: Lab Results  Component Value Date   VD25OH 33 08/12/2016   VD25OH 27 (L) 12/02/2015   VD25OH 30 06/26/2015   Pt is on calcium 400 mg - vitamin D 55 units 2x a day; on vitamin D 1000 units 2x a day supplements.   No weight bearing exercises. Only walking.   She does not take high vitamin A doses.  Pt does not have a FH of osteoporosis.  Testosterone and IGF1 levels were normal in the past per review of Dr. Liliane Bradley notes.  No h/o hyper/hypocalcemia or hyperparathyroidism. No h/o kidney stones. Lab Results  Component Value Date   PTH 21 12/02/2015   CALCIUM 8.8 08/12/2016   CALCIUM 9.2 12/02/2015   CALCIUM 9.2 12/02/2015   CALCIUM 9.0 06/26/2015   CALCIUM 9.1 12/28/2013    CALCIUM TEST NOT PERFORMED 06/30/2013   CALCIUM 9.7 01/08/2013   CALCIUM 9.5 03/24/2012   CALCIUM 9.9 05/20/2011   No h/o thyrotoxicosis. Reviewed TSH recent levels:  Lab Results  Component Value Date   TSH 2.38 06/26/2015   No h/o CKD. Last BUN/Cr: Lab Results  Component Value Date   BUN 9 08/12/2016   CREATININE 0.80 08/12/2016    ROS: Constitutional: no weight gain/loss, no fatigue, no subjective hyperthermia/hypothermia Eyes: no blurry vision, no xerophthalmia ENT: no sore throat, no nodules palpated in throat, no dysphagia/odynophagia, no hoarseness Cardiovascular: no CP/SOB/palpitations/leg swelling Respiratory: no cough/SOB Gastrointestinal: no N/V/D/C Musculoskeletal: no muscle/joint aches Skin: no rashes Neurological: no tremors/numbness/tingling/dizziness Psychiatric: no depression/anxiety  Past Medical History:  Diagnosis Date  . C. difficile colitis NOV 2013  . Cerebral palsy ([place])   . Crohn's disease ([place]) 2008 TCS POS SBFT NL   Rx: Prednisone then 6-MP   Past Surgical History:  Procedure Laterality Date  . COLONOSCOPY  2008   COLITIS, bX: POS GRANULOMA-->CD   Social History   Socioeconomic History  . Marital status: Single    Spouse name: Not on file  . Number of children: 0  Occupational History  . Not on file  . Smoking status: Never Smoker  . Smokeless tobacco: Never Used  Substance and Sexual Activity  . Alcohol use:  No  . Drug use: No  . Sexual activity: Not on file  Social History Narrative   WAS HOME SCHOOLED. HAS ONE SISTER.   MOM NOW DRIVES.   Current Outpatient Medications on File Prior to Visit  Medication Sig Dispense Refill  . benzonatate (TESSALON PERLES) 100 MG capsule 1 PO TID WHEN NEEDED FOR COUGH 30 capsule 1  . budesonide-formoterol (SYMBICORT) 80-4.5 MCG/ACT inhaler Inhale 2 puffs into the lungs 2 (two) times daily. FOR 7 DAYS THE 2 PUFFS DAILY FOR 7 DAYS (Patient taking differently: Inhale 2 puffs into the lungs as  needed. FOR 7 DAYS THE 2 PUFFS DAILY FOR 7 DAYS) 1 Inhaler 0  . calcium citrate (CALCITRATE - DOSED IN MG ELEMENTAL CALCIUM) 950 MG tablet Take 2 tablets by mouth daily.    . cholecalciferol (VITAMIN D) 1000 units tablet Take 2,000 Units by mouth daily.    . folic acid (FOLVITE) 1 MG tablet Take 1 tablet (1 mg total) daily by mouth. 90 tablet 3  . mercaptopurine (PURINETHOL) 50 MG tablet Take 1 tablet (50 mg total) daily by mouth. Give on an empty stomach 1 hour before or 2 hours after meals. Caution: Chemotherapy. 90 tablet 3  . polyethylene glycol powder (GLYCOLAX/MIRALAX) powder Take 17 g by mouth daily. (Patient taking differently: Take 17 g by mouth as needed. ) 527 g 3  . Probiotic CAPS 1 PO DAILY. 30 capsule 11  . Wheat Dextrin (BENEFIBER) POWD 1 SCOOP PO TID (Patient taking differently: as needed. 1 SCOOP PO TID) 730 g 11  . acetaminophen (TYLENOL) 500 MG tablet Take 500 mg by mouth as needed for pain.     No current facility-administered medications on file prior to visit.    Allergies  Allergen Reactions  . Other Other (See Comments)    Certain antibiotics CAN NOT be taken due to Crohn's disease   . Amoxicillin Rash   Family History  Problem Relation Age of Onset  . Colon cancer Neg Hx   . Colon polyps Neg Hx   . Crohn's disease Neg Hx    PE: BP 110/78 (BP Location: Right Arm, Patient Position: Sitting, Cuff Size: Normal)   Pulse (!) 120   Ht 5' 5"  (1.651 m)   Wt 147 lb (66.7 kg)   SpO2 98%   BMI 24.46 kg/m  Wt Readings from Last 3 Encounters:  07/16/17 147 lb (66.7 kg)  07/14/17 147 lb 12.8 oz (67 kg)  01/13/17 140 lb (63.5 kg)   Constitutional: normal weight, in NAD. No kyphosis.  Very pale skin. Eyes: PERRLA, EOMI, no exophthalmos ENT: moist mucous membranes, no thyromegaly, no cervical lymphadenopathy Cardiovascular: Tachycardia, RR, No MRG Respiratory: CTA B Gastrointestinal: abdomen soft, NT, ND, BS+ Musculoskeletal: no deformities, strength intact in all  4 Skin: moist, warm, no rashes, + abundant acne on face  Neurological: no tremor with outstretched hands, DTR normal in all 4  Assessment: 1. Osteoporosis  2. 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  I explained that we need to look at the Z scores, rather than T-scores, since these are the standard for patients his age.  A Z score lower than 2.0 [place] low bone mineral density for age.  He has had one value lower than 2 in 2013, at the level of his right femoral neck, however, this has improved since then.  Unfortunately, his scans were done on different machines,  with the exception of the last 2 scans from 2017 and 2019, which have been done at [facility].  We discussed that from now on, we need to continue to have them on the same machine.  Reviewing the Z scores from the last 2 checks, they are higher than -2.0 at the level of the spine and also at the level of both femoral necks. - We discussed about the need for a normal vitamin D level and for now we continued his supplement of 2000 units daily.  However, I will also check a vitamin D level today and will advise him about any change in dose needed.  I did advise him that he will need to have a vitamin D level checked yearly from now on. - We also reviewed his diet and I suggested to reduce the amount of milk and other dairy he is eating due to the fact that this will increase the acidity of his blood with subsequent calcium exit from bones to buffer this.  I advised him to switch to almond milk and basically suggested low acid diet. - I also suggested to start weightbearing exercises.  As of now, he is not doing any exercise but we discussed about options: I advised him to buy a small treadmill and start walking and possibly even running.  I also suggested to get dumbbells and start some weight exercises. - discussed fall  precautions   - given handout from [place] Re: weight bearing exercises - advised to do this every day or at least 5/7 days - I do not feel that he needs any medication for now, with normal Z scores - Discussed about having a repeat bone density scan in 4 to 5 years from now.  I will see him back if  the scores are lower - Will check the following labs today: Orders Placed This Encounter  Procedures  . TSH  . VITAMIN D 25 Hydroxy (Vit-D Deficiency, Fractures)  . Phosphorus  . Parathyroid hormone, intact (no Ca)  . BASIC METABOLIC PANEL WITH GFR  -I will see the patient back as needed  2. H/o vitamin D deficiency -Reviewed latest vitamin D level and this was normal a year ago -Continue 2000 units vitamin D daily for now -Recheck level today  Component     Latest Ref Rng & Units 07/16/2017          Glucose     65 - 99 mg/dL 103 (H)  BUN     7 - 25 mg/dL 8  Creatinine     0.60 - 1.35 mg/dL 0.78  GFR, Est Non African American     > OR = 60 mL/min/1.89m 124  GFR, Est African American     > OR = 60 mL/min/1.772m143  BUN/Creatinine Ratio     6 - 22 (calc) NOT APPLICABLE  Sodium     13347 146 mmol/L 140  Potassium     3.5 - 5.3 mmol/L 4.3  Chloride     98 - 110 mmol/L 106  CO2     20 - 32 mmol/L 26  Calcium     8.6 - 10.3 mg/dL 9.4  PTH, Intact     15 - 65 pg/mL 10 (L)  TSH     0.35 - 4.50 uIU/mL 3.23  VITD     30.00 - 100.00 ng/mL 18.66 (L)  Phosphorus     2.3 - 4.6 mg/dL 3.0   Normal labs, with the  exception of slightly high glucose (nonfasting) and low vitamin D.  We will increase his vitamin D to 5000 units daily and recheck his vitamin D level in 2 months.   Normal TSH. His PTH level was slightly low, but in the setting of a normal calcium and phosphorus, I doubt this is significant.  Tanner Kingdom, MD PhD [facility] Endocrinology

## 2017-07-16 NOTE — Patient Instructions (Addendum)
Your bone densities have improved over the years:  Date L1-L4 Z score FN Z score  [place] (05/17/2017)  -0.6 R: -1.6 L: -1.9  [place] (06/26/2015)  -0.5 R: -1.7 L: -1.9  [place] (06/23/2013)  -0.4 R: -1.0 L: -1.2  [place] (09/21/2011)  -1.4 R: -2.1 L: -1.6   Please have a vitamin D level checked every year.   Please have another bone density scan in 4-5 years from now at [facility].  Start weight bearing exercises: Exercise for Strong Bones (from [place]) There are two types of exercises that are important for building and maintaining bone density:  weight-bearing and muscle-strengthening exercises. Weight-bearing Exercises These exercises include activities that make you move against gravity while staying upright. Weight-bearing exercises can be high-impact or low-impact. High-impact weight-bearing exercises help build bones and keep them strong. If you have broken a bone due to osteoporosis or are at risk of breaking a bone, you may need to avoid high-impact exercises. If you're not sure, you should check with your healthcare provider. Examples of high-impact weight-bearing exercises are: . Dancing . Doing high-impact aerobics . Hiking . Jogging/running . Jumping Rope . Stair climbing . Tennis Low-impact weight-bearing exercises can also help keep bones strong and are a safe alternative if you cannot do high-impact exercises. Examples of low-impact weight-bearing exercises are: . Using elliptical training machines . Doing low-impact aerobics . Using stair-step machines . Fast walking on a treadmill or outside Muscle-Strengthening Exercises These exercises include activities where you move your body, a weight or some other resistance against gravity. They are also known as resistance exercises and include: . Lifting weights . Using elastic exercise bands . Using weight machines . Lifting your own body weight . Functional movements, such as  standing and rising up on your toes Yoga and Pilates can also improve strength, balance and flexibility. However, certain positions may not be safe for people with osteoporosis or those at increased risk of broken bones. For example, exercises that have you bend forward may increase the chance of breaking a bone in the spine. A physical therapist should be able to help you learn which exercises are safe and appropriate for you. Non-Impact Exercises Non-impact exercises can help you to improve balance, posture and how well you move in everyday activities. These exercises can also help to increase muscle strength and decrease the risk of falls and broken bones. Some of these exercises include: . Balance exercises that strengthen your legs and test your balance, such as Tai Chi, can decrease your risk of falls. . Posture exercises that improve your posture and reduce rounded or "sloping" shoulders can help you decrease the chance of breaking a bone, especially in the spine. . Functional exercises that improve how well you move can help you with everyday activities and decrease your chance of falling and breaking a bone. For example, if you have trouble getting up from a chair or climbing stairs, you should do these activities as exercises. A physical therapist can teach you balance, posture and functional exercises. Starting a New Exercise Program If you haven't exercised regularly for a while, check with your healthcare provider before beginning a new exercise program-particularly if you have health problems such as heart disease, diabetes or high blood pressure. If you're at high risk of breaking a bone, you should work with a physical therapist to develop a safe exercise program. Once you have your healthcare provider's approval, start slowly. If you've already broken bones in the  spine because of osteoporosis, be very careful to avoid activities that require reaching down, bending forward, rapid twisting  motions, heavy lifting and those that increase your chance of a fall. As you get started, your muscles may feel sore for a day or two after you exercise. If soreness lasts longer, you may be working too hard and need to ease up. Exercises should be done in a pain-free range of motion. How Much Exercise Do You Need? Weight-bearing exercises 30 minutes on most days of the week. Do a 30-minutesession or multiple sessions spread out throughout the day. The benefits to your bones are the same.   Muscle-strengthening exercises Two to three days per week. If you don't have much time for strengthening/resistance training, do small amounts at a time. You can do just one body part each day. For example do arms one day, legs the next and trunk the next. You can also spread these exercises out during your normal day.  Balance, posture and functional exercises Every day or as often as needed. You may want to focus on one area more than the others. If you have fallen or lose your balance, spend time doing balance exercises. If you are getting rounded shoulders, work more on posture exercises. If you have trouble climbing stairs or getting up from the couch, do more functional exercises. You can also perform these exercises at one time or spread them during your day. Work with a phyiscal therapist to learn the right exercises for you.   Please return as needed.

## 2017-07-17 LAB — PARATHYROID HORMONE, INTACT (NO CA): PTH: 10 pg/mL — ABNORMAL LOW (ref 15–65)

## 2017-07-18 ENCOUNTER — Other Ambulatory Visit: Payer: Self-pay | Admitting: Gastroenterology

## 2017-07-19 ENCOUNTER — Telehealth: Payer: Self-pay

## 2017-07-19 NOTE — Addendum Note (Signed)
Addended by: Philemon Kingdom on: 07/19/2017 05:31 PM   Modules accepted: Orders

## 2017-07-19 NOTE — Telephone Encounter (Signed)
Pt's mom, Vickie, called to say pt had an excellent visit with Dr. Cruzita Lederer on 07/16/2017.  Said pt will not have to have follow up visits unless needed. She did some labs. She would like for [name] to monitor Vit D level once a year and next Dexa in 4-5 years.  She said notes in epic for [name] to review and she just wanted [place] to know how happy they were with the visit.

## 2017-07-20 NOTE — Telephone Encounter (Signed)
REVIEWED-NO ADDITIONAL RECOMMENDATIONS. 

## 2017-07-27 ENCOUNTER — Telehealth: Payer: Self-pay | Admitting: Internal Medicine

## 2017-07-27 NOTE — Telephone Encounter (Signed)
No DPR on file.

## 2017-07-27 NOTE — Telephone Encounter (Signed)
Patient mom is calling for the results of patitnt labs

## 2017-08-01 ENCOUNTER — Emergency Department (HOSPITAL_COMMUNITY): Payer: Medicaid Other

## 2017-08-01 ENCOUNTER — Encounter (HOSPITAL_COMMUNITY): Payer: Self-pay | Admitting: Emergency Medicine

## 2017-08-01 ENCOUNTER — Emergency Department (HOSPITAL_COMMUNITY)
Admission: EM | Admit: 2017-08-01 | Discharge: 2017-08-01 | Disposition: A | Discharge status: Home / Self Care | Payer: Medicaid Other | Attending: Emergency Medicine | Admitting: Emergency Medicine

## 2017-08-01 DIAGNOSIS — Z79899 Other long term (current) drug therapy: Secondary | ICD-10-CM | POA: Insufficient documentation

## 2017-08-01 DIAGNOSIS — G809 Cerebral palsy, unspecified: Secondary | ICD-10-CM | POA: Insufficient documentation

## 2017-08-01 DIAGNOSIS — R569 Unspecified convulsions: Secondary | ICD-10-CM

## 2017-08-01 HISTORY — DX: Vitamin D deficiency, unspecified: E55.9

## 2017-08-01 HISTORY — DX: Other specified disorders of bone density and structure, unspecified site: M85.80

## 2017-08-01 HISTORY — DX: Unspecified convulsions: R56.9

## 2017-08-01 LAB — CBC
HCT: 45.3 % (ref 39.0–52.0)
Hemoglobin: 14.4 g/dL (ref 13.0–17.0)
MCH: 28.4 pg (ref 26.0–34.0)
MCHC: 31.8 g/dL (ref 30.0–36.0)
MCV: 89.3 fL (ref 78.0–100.0)
Platelets: 553 10*3/uL — ABNORMAL HIGH (ref 150–400)
RBC: 5.07 MIL/uL (ref 4.22–5.81)
RDW: 15.2 % (ref 11.5–15.5)
WBC: 9.3 10*3/uL (ref 4.0–10.5)

## 2017-08-01 LAB — COMPREHENSIVE METABOLIC PANEL
ALT: 18 U/L (ref 17–63)
AST: 26 U/L (ref 15–41)
Albumin: 4.1 g/dL (ref 3.5–5.0)
Alkaline Phosphatase: 85 U/L (ref 38–126)
Anion gap: 11 (ref 5–15)
BUN: 9 mg/dL (ref 6–20)
CO2: 25 mmol/L (ref 22–32)
Calcium: 9.1 mg/dL (ref 8.9–10.3)
Chloride: 101 mmol/L (ref 101–111)
Creatinine, Ser: 0.88 mg/dL (ref 0.61–1.24)
GFR calc Af Amer: >60 mL/min (ref >60)
GFR calc non Af Amer: >60 mL/min (ref >60)
Glucose, Bld: 134 mg/dL — ABNORMAL HIGH (ref 65–99)
Potassium: 3.4 mmol/L — ABNORMAL LOW (ref 3.5–5.1)
Sodium: 137 mmol/L (ref 135–145)
Total Bilirubin: 0.5 mg/dL (ref 0.3–1.2)
Total Protein: 8.8 g/dL — ABNORMAL HIGH (ref 6.5–8.1)

## 2017-08-01 MED ORDER — LEVETIRACETAM 500 MG PO TABS: 500.0000 mg | ORAL_TABLET | Freq: Two times a day (BID) | ORAL | 0 refills | Status: DC

## 2017-08-01 MED ORDER — LEVETIRACETAM IN NACL 1000 MG/100ML IV SOLN
1000.0000 mg | Freq: Once | INTRAVENOUS | Status: AC
  Administered 2017-08-01: 1000 mg via INTRAVENOUS
  Filled 2017-08-01: qty 100

## 2017-08-01 NOTE — ED Notes (Signed)
Pt remains alert and oriented.

## 2017-08-01 NOTE — ED Notes (Signed)
Seizure pads placed on bed

## 2017-08-01 NOTE — ED Notes (Signed)
Pt alert at this time and smiling.  Parents at bedside.   Per parents pt at baseline.   Seizure pads on bed.

## 2017-08-01 NOTE — Discharge Instructions (Signed)
Please start Keppra twice daily for the next 30 days, follow-up with the neurologist listed above or the neurologist of your choice.  You should be seen within the next week.  If you continue to have seizures or any severe or worsening symptoms return to the emergency department for a repeat evaluation.  The CT scan was reassuring and normal with no signs of bleeding or tumors.

## 2017-08-01 NOTE — ED Triage Notes (Signed)
Pt found by mother having a seizure.  States the noise of him seizing is what woke her up.  Pt was postictal on ems arrival and was incontinent of urine.  Pt is alert and oriented on arrival to ED.

## 2017-08-01 NOTE — ED Notes (Signed)
Assisted patient with use of urinal. Pt able to stand and ambulate to other side of the room with minimal assistance. Per family this is baseline

## 2017-08-01 NOTE — ED Provider Notes (Signed)
[facility] EMERGENCY DEPARTMENT Provider Note   CSN: [id number] Arrival date & time: 08/01/17  4332     History   Chief Complaint Chief Complaint  Patient presents with  . Seizures    HPI Tanner Bradley is a 27 y.o. male.  HPI  The patient is a 27 year old male with a known history of cerebral palsy as well as Crohn's disease.  He has a history of seizures when he was a child less than 1 year of age but has not had seizures since that time and is no longer treated with medications or followed by neurology.  He presents with his parents who are excellent historians and report that the mother particularly was awoken from sleep a short time prior to arrival listening to some abnormal sounds that he was making in the room beside her.  When she got to him he was making a choking sound, frothing at the mouth and unresponsive.  When the paramedics arrived the patient had improved and was close back to his baseline and by the time they arrived to the hospital he is back to his normal self according to the mother.  Despite his cerebral palsy he is able to walk, he is able to play videogames, he is not able to drive.  She states that he is totally back to normal.  Evidently per the paramedics report there was some urinary incontinence and some tongue biting.  The patient has no complaints at this time.  Level 5 caveat applies secondary to underlying cerebral palsy and historical difficulties.  Past Medical History:  Diagnosis Date  . C. difficile colitis NOV 2013  . Cerebral palsy ([place])   . Crohn's disease ([place]) 2008 TCS POS SBFT NL   Rx: Prednisone then 6-MP  . Osteopenia   . Seizures ([place]) 1992  . Vitamin D deficiency     Patient Active Problem List   Diagnosis Date Noted  . Vitamin D deficiency 02/06/2015  . Normocytic anemia 04/27/2013  . Constipation 02/28/2013  . C. difficile colitis 03/24/2012  . 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 Crohn's disease of colon ([place])  10/16/2010    Past Surgical History:  Procedure Laterality Date  . COLONOSCOPY  2008   COLITIS, bX: POS GRANULOMA-->CD        Home Medications    Prior to Admission medications   Medication Sig Start Date End Date Taking? Authorizing Provider  acetaminophen (TYLENOL) 500 MG tablet Take 500 mg by mouth as needed for pain.    [provider]  benzonatate (TESSALON PERLES) 100 MG capsule 1 PO TID WHEN NEEDED FOR COUGH 07/14/17   Fields, Marga Melnick, MD  budesonide-formoterol (SYMBICORT) 80-4.5 MCG/ACT inhaler Inhale 2 puffs into the lungs 2 (two) times daily. FOR 7 DAYS THE 2 PUFFS DAILY FOR 7 DAYS Patient taking differently: Inhale 2 puffs into the lungs as needed. FOR 7 DAYS THE 2 PUFFS DAILY FOR 7 DAYS 06/19/15   Fields, Marga Melnick, MD  calcium citrate (CALCITRATE - DOSED IN MG ELEMENTAL CALCIUM) 950 MG tablet Take 2 tablets by mouth daily.    [provider]  cholecalciferol (VITAMIN D) 1000 units tablet Take 2,000 Units by mouth daily.    [provider]  folic acid (FOLVITE) 1 MG tablet TAKE 1 TABLET BY MOUTH ONCE DAILY 07/18/17   Mahala Menghini, PA-C  levETIRAcetam (KEPPRA) 500 MG tablet Take 1 tablet (500 mg total) by mouth 2 (two) times daily. 08/01/17 08/31/17  Noemi Chapel, MD  mercaptopurine (PURINETHOL) 50 MG tablet TAKE 1 TABLET BY MOUTH ONCE DAILY(GIVE ON AN EMPTY STOMACH 1 HOUR BEFORE OR 2 HOURS AFTER MEALS) 07/18/17   Mahala Menghini, PA-C  polyethylene glycol powder (GLYCOLAX/MIRALAX) powder Take 17 g by mouth daily. Patient taking differently: Take 17 g by mouth as needed.  02/06/15   Fields, Marga Melnick, MD  Probiotic CAPS 1 PO DAILY. 07/30/14   Fields, Marga Melnick, MD  Wheat Dextrin (BENEFIBER) POWD 1 SCOOP PO TID Patient taking differently: as needed. 1 SCOOP PO TID 07/30/14   Danie Binder, MD    Family History Family History  Problem Relation Age of Onset  . Colon cancer Neg Hx   . Colon polyps Neg Hx   . Crohn's disease Neg Hx     Social  History Social History   Tobacco Use  . Smoking status: Never Smoker  . Smokeless tobacco: Never Used  Substance Use Topics  . Alcohol use: No  . Drug use: No     Allergies   Other and Amoxicillin   Review of Systems Review of Systems  Unable to perform ROS: Other     Physical Exam Updated Vital Signs BP 116/83   Pulse (!) 111   Temp 98.4 F (36.9 C) (Oral)   Resp 19   Ht 5' 5"  (1.651 m)   Wt 66.7 kg (147 lb)   SpO2 98%   BMI 24.46 kg/m   Physical Exam  Constitutional: He appears well-developed and well-nourished. No distress.  HENT:  Head: Normocephalic and atraumatic.  Mouth/Throat: Oropharynx is clear and moist. No oropharyngeal exudate.  Small amount of tongue biting on the distal tongue on the right side, no repairable lacerations  Eyes: Pupils are equal, round, and reactive to light. Conjunctivae and EOM are normal. Right eye exhibits no discharge. Left eye exhibits no discharge. No scleral icterus.  Neck: Normal range of motion. Neck supple. No JVD present. No thyromegaly present.  Cardiovascular: Regular rhythm, normal heart sounds and intact distal pulses. Exam reveals no gallop and no friction rub.  No murmur heard. Tachycardic to 130 but normal pulses at the radial arteries  Pulmonary/Chest: Effort normal and breath sounds normal. No respiratory distress. He has no wheezes. He has no rales.  Abdominal: Soft. Bowel sounds are normal. He exhibits no distension and no mass. There is no tenderness.  Musculoskeletal: Normal range of motion. He exhibits no edema or tenderness.  Lymphadenopathy:    He has no cervical adenopathy.  Neurological: He is alert. Coordination normal.  The patient has normal grips, he is able to finger-nose-finger, he has symmetrical face, he is interactive, he is able to lift both of his legs, there is no memory of events  Skin: Skin is warm and dry. No rash noted. No erythema.  Psychiatric: He has a normal mood and affect. His  behavior is normal.  Nursing note and vitals reviewed.    ED Treatments / Results  Labs (all labs ordered are listed, but only abnormal results are displayed) Labs Reviewed  CBC - Abnormal; Notable for the following components:      Result Value   Platelets 553 (*)    All other components within normal limits  COMPREHENSIVE METABOLIC PANEL - Abnormal; Notable for the following components:   Potassium 3.4 (*)    Glucose, Bld 134 (*)    Total Protein 8.8 (*)    All other components within normal limits    EKG EKG Interpretation  Date/Time:  Sunday  Aug 01 2017 07:35:50 EDT Ventricular Rate:  133 PR Interval:    QRS Duration: 88 QT Interval:  302 QTC Calculation: 450 R Axis:   13 Text Interpretation:  Sinus tachycardia LAE, consider biatrial enlargement RSR' in V1 or V2, probably normal variant Baseline wander in lead(s) V1 since last tracing no significant change Confirmed by Noemi Chapel [phone]) on 08/01/2017 7:39:56 AM   Radiology Ct Head Wo Contrast  Result Date: 08/01/2017 CLINICAL DATA:  Seizure.  Cerebral palsy. EXAM: CT HEAD WITHOUT CONTRAST TECHNIQUE: Contiguous axial images were obtained from the base of the skull through the vertex without intravenous contrast. COMPARISON:  02/18/2007. FINDINGS: Brain: Normal appearing cerebral hemispheres and posterior fossa structures. Normal size and position of the ventricles. No intracranial hemorrhage, mass lesion or CT evidence of acute infarction. Vascular: No hyperdense vessel or unexpected calcification. Skull: Normal. Negative for fracture or focal lesion. Sinuses/Orbits: Unremarkable. Other: None. IMPRESSION: Normal examination. Electronically Signed   By: Claudie Revering M.D.   On: 08/01/2017 09:13    Procedures Procedures (including critical care time)  Medications Ordered in ED Medications  levETIRAcetam (KEPPRA) IVPB 1000 mg/100 mL premix (0 mg Intravenous Stopped 08/01/17 0909)     Initial Impression / Assessment  and Plan / ED Course  I have reviewed the triage vital signs and the nursing notes.  Pertinent labs & imaging results that were available during my care of the patient were reviewed by me and considered in my medical decision making (see chart for details).    The patient had what appears to be some type of neurologic event, I suspect this was seizure-like.  He has not had seizures in over 25 years, with his underlying cerebral palsy he will need further neuro imaging and work-up to make sure this is not related to an electrolyte abnormality or an underlying brain abnormality such as a hemorrhage or mass.  The family is in agreement with the plan.  CT scan negative, labs unremarkable, patient informed, family informed, Keppra started, neurology follow-up needed, patient stable for discharge, no further seizures in the emergency department.  Patient is back at baseline, tachycardia improving.  Final Clinical Impressions(s) / ED Diagnoses   Final diagnoses:  Seizure [facility])    ED Discharge Orders        Ordered    levETIRAcetam (KEPPRA) 500 MG tablet  2 times daily     08/01/17 0955       Noemi Chapel, MD 08/01/17 [phone]

## 2017-08-03 ENCOUNTER — Telehealth: Payer: Self-pay

## 2017-08-03 NOTE — Telephone Encounter (Signed)
Pt's mom, Tanner Bradley, called to let [name] know that pt had a seizure on Sunday.  He had to go to ED ( please see notes). He has been started on Keppra 500 mg bid.  Tanner Bradley would like a call from [name] when possible, as she has been reading and said that sometimes Crohn's patients would have seizures.  He was referred to Dr. Merlene Laughter and will be followed by him.  She had wanted pt to come to the office to see [name] this week, but is aware that [name] is not in the office this week. She is just so upset about this incident and would really appreciate a call from [name] to discuss her concerns.

## 2017-08-04 NOTE — Telephone Encounter (Signed)
DISCUSSED EVENT. SEEING DR. Merlene Laughter. NO RELATION BETWEEN CROHN'S DISEASE AD SEIZURES MOST LIKELY RELATED TO UNDERLYING CNS DISEASE. CALL WITH QUESTIONS OR CONCERNS.

## 2017-08-17 ENCOUNTER — Telehealth: Payer: Self-pay

## 2017-08-17 NOTE — Telephone Encounter (Signed)
Pt's mom called crying and so worried about her son. He saw Dr. Merlene Laughter yesterday and she is very concerned about what he said. They have scheduled an EEG on 09/03/2017.  She said they are not scheduled to see [name] for awhile. She is begging to get in to see [name] ASAP. She said if She can just take a look at her son, she will feel so much better. She just appreciates so much how [name] has always taken care of him and she is the only doctor she feels comfortable with for her son. Although, Dr. Merlene Laughter and staff were all very good yesterday. She just knows [name] and wants so much to be able to talk with her and let her see Abbie. I told her that Dr. Eden Lathe is at the hospital doing procedures but I will send her a message to advise!

## 2017-08-17 NOTE — Telephone Encounter (Signed)
HAVE PT COME TO short stay @1230p  JUN 12.

## 2017-08-17 NOTE — Telephone Encounter (Signed)
PT's mom is aware.

## 2017-08-18 NOTE — Telephone Encounter (Signed)
REVIEWED-NO ADDITIONAL RECOMMENDATIONS. 

## 2017-08-18 NOTE — Telephone Encounter (Signed)
Faxed Dr. Lynetta Mare OV note to Endo for [name].

## 2017-09-13 ENCOUNTER — Telehealth: Payer: Self-pay

## 2017-09-13 NOTE — Telephone Encounter (Signed)
Pt's mom called to say his EEG was normal and Dr. Nona Dell should be getting a copy soon.

## 2017-09-14 ENCOUNTER — Ambulatory Visit: Payer: Medicaid Other | Admitting: "Endocrinology

## 2017-09-22 NOTE — Telephone Encounter (Signed)
REVIEWED-NO ADDITIONAL RECOMMENDATIONS. 

## 2017-10-29 ENCOUNTER — Other Ambulatory Visit: Payer: Self-pay | Admitting: Gastroenterology

## 2017-11-15 ENCOUNTER — Other Ambulatory Visit: Payer: Self-pay | Admitting: Gastroenterology

## 2017-11-16 ENCOUNTER — Other Ambulatory Visit: Payer: Self-pay | Admitting: Gastroenterology

## 2017-11-17 ENCOUNTER — Other Ambulatory Visit: Payer: Self-pay | Admitting: Gastroenterology

## 2017-12-23 ENCOUNTER — Encounter: Payer: Self-pay | Admitting: Gastroenterology

## 2017-12-23 ENCOUNTER — Ambulatory Visit: Payer: Medicaid Other | Admitting: Gastroenterology

## 2017-12-23 DIAGNOSIS — D649 Anemia, unspecified: Secondary | ICD-10-CM

## 2017-12-23 DIAGNOSIS — K5010 Crohn's disease of large intestine without complications: Secondary | ICD-10-CM | POA: Diagnosis not present

## 2017-12-23 DIAGNOSIS — K5901 Slow transit constipation: Secondary | ICD-10-CM

## 2017-12-23 DIAGNOSIS — T380X5A Adverse effect of glucocorticoids and synthetic analogues, initial encounter: Secondary | ICD-10-CM

## 2017-12-23 DIAGNOSIS — E559 Vitamin D deficiency, unspecified: Secondary | ICD-10-CM

## 2017-12-23 DIAGNOSIS — M8580 Other specified disorders of bone density and structure, unspecified site: Secondary | ICD-10-CM

## 2017-12-23 MED ORDER — MERCAPTOPURINE 50 MG PO TABS: ORAL_TABLET | ORAL | 3 refills | Status: DC

## 2017-12-23 MED ORDER — FOLIC ACID 1 MG PO TABS: 1.0000 mg | ORAL_TABLET | Freq: Every day | ORAL | 3 refills | Status: DC

## 2017-12-23 MED ORDER — VITAMIN D (ERGOCALCIFEROL) 1.25 MG (50000 UNIT) PO CAPS: 50000.0000 [IU] | ORAL_CAPSULE | ORAL | 0 refills | Status: DC

## 2017-12-23 NOTE — Assessment & Plan Note (Signed)
LAST VIT D LEVEL LOW.  YOU NEED VITAMIN D 50,000 UNITS A WEEK FOR 8 WEEKS. YOU SHOULD TAKE CALCIUM 500 MG TWO A DAY FOREVER.  CONTINUE VITAMIN D 2000 UNITS DAILY. FOLLOW UP IN 6 MOS.

## 2017-12-23 NOTE — Assessment & Plan Note (Signed)
SYMPTOMS CONTROLLED/RESOLVED.  CONTINUE PROBIOTICS AND BENEFIBER. FOLLOW UP IN 6 MOS.

## 2017-12-23 NOTE — Assessment & Plan Note (Signed)
NO FOCAL DEFICITS STEROID CURRENTLY.  REPEAT DEXA MAR 2021. VITAMIN D 50,000 UNITS A WEEK FOR 8 WEEKS. CONTINUE CALCIUM 500 MG TWO A DAY FOREVER.  CONTINUE VITAMIN D 2000 UNITS DAILY. FOLLOW UP IN 6 MOS.

## 2017-12-23 NOTE — Progress Notes (Signed)
Subjective:    Patient ID: Tanner Bradley, male    DOB: 03/05/1991, 27 y.o.   MRN: [id number]  Danie Binder, MD  HPI Been gaining weight. GAINED WEIGHT SINCE MAY 2019. BEEN DRINKING DR. Malachi Bonds. NEURO VISIT Q6 MOS. BMs: #4(REGULAR, EVERY DAY SAME TIME). SAW DR. GHERGHE IN MAY 2019. WORKING ON [employer] AND [employer] AND [employer]. LIKES [employer] AND [employer]ER [employer]. NO FLU SHOT. GOT TETANUS SHOT.  PT DENIES FEVER, CHILLS, HEMATOCHEZIA, HEMATEMESIS, nausea, vomiting, melena, diarrhea, CHEST PAIN, SHORTNESS OF BREATH,  CHANGE IN BOWEL IN HABITS, constipation, abdominal pain, problems swallowing, OR heartburn or indigestion.  Past Medical History:  Diagnosis Date  . C. difficile colitis NOV 2013  . Cerebral palsy ([place])   . Crohn's disease ([place]) 2008 TCS POS SBFT NL   Rx: Prednisone then 6-MP  . Osteopenia   . Seizures ([place]) 1992  . Vitamin D deficiency    Past Surgical History:  Procedure Laterality Date  . COLONOSCOPY  2008   COLITIS, bX: POS GRANULOMA-->CD   Allergies  Allergen Reactions  . Other Other (See Comments)    Certain antibiotics CAN NOT be taken due to Crohn's disease   . Amoxicillin Rash   Current Outpatient Medications  Medication Sig    . acetaminophen (TYLENOL) 500 MG tablet Take 500 mg by mouth as needed for pain.    . benzonatate (TESSALON PERLES) 100 MG capsule 1 PO TID WHEN NEEDED FOR COUGH    . budesonide-formoterol (SYMBICORT) 80-4.5 MCG/ACT inhaler Inhale 2 puffs into the lungs 2 (two) times daily. FOR 7 DAYS THE 2 PUFFS DAILY FOR 7 DAYS     . calcium citrate (CALCITRATE - DOSED IN MG ELEMENTAL CALCIUM) 950 MG tablet Take 2 tablets by mouth daily.    . cholecalciferol (VITAMIN D) 1000 units tablet Take 2,000 Units by mouth daily.    . folic acid (FOLVITE) 1 MG tablet TAKE 1 TABLET BY MOUTH ONCE DAILY    . levETIRAcetam (KEPPRA) 500 MG tablet Take 1 tablet (500 mg total) by mouth 2 (two) times daily.    . mercaptopurine (PURINETHOL) 50 MG tablet TAKE 1  TABLET BY MOUTH ONCE DAILY(GIVE ON AN EMPTY STOMACH 1 HOUR BEFORE OR 2 HOURS AFTER MEALS)    . polyethylene glycol powder (GLYCOLAX/MIRALAX) powder Take 17 g by mouth daily. (Patient taking differently: Take 17 g by mouth as needed. )    . Probiotic CAPS 1 PO DAILY.    Marland Kitchen Wheat Dextrin (BENEFIBER) POWD 1 SCOOP PO TID SCOOP PO TID    .      Marland Kitchen       Review of Systems PER HPI OTHERWISE ALL SYSTEMS ARE NEGATIVE.    Objective:   Physical Exam  Constitutional: He is oriented to person, place, and time. He appears well-developed and well-nourished. No distress.  HENT:  Head: Normocephalic and atraumatic.  Mouth/Throat: Oropharynx is clear and moist. No oropharyngeal exudate.  Eyes: Pupils are equal, round, and reactive to light. No scleral icterus.  Neck: Normal range of motion. Neck supple.  Cardiovascular: Normal rate, regular rhythm and normal heart sounds.  Pulmonary/Chest: Effort normal and breath sounds normal. No respiratory distress.  Abdominal: Soft. Bowel sounds are normal. He exhibits no distension. There is no tenderness.  Musculoskeletal: He exhibits no edema.  Lymphadenopathy:    He has no cervical adenopathy.  Neurological: He is alert and oriented to person, place, and time.  NO  NEW FOCAL DEFICITS  Psychiatric: He has  a normal mood and affect.  Vitals reviewed.     Assessment & Plan:

## 2017-12-23 NOTE — Assessment & Plan Note (Signed)
LAST HB MAY 2019 NL. NO BRBPR OR MELENA.  CONTINUE TO MONITOR SYMPTOMS.

## 2017-12-23 NOTE — Patient Instructions (Addendum)
YOU NEED VITAMIN D 50,000 UNITS A WEEK FOR 8 WEEKS.  YOU SHOULD TAKE CALCIUM 500 MG TWO A DAY FOREVER.   CONTINUE VITAMIN D 2000 UNITS DAILY.  CONTINUE PROBIOTICS, MERCAPTOPURINE, AND BENEFIBER.  FOLLOW UP IN 6 MOS.

## 2017-12-23 NOTE — Assessment & Plan Note (Signed)
SYMPTOMS CONTROLLED/RESOLVED.  CONTINUE PROBIOTICS, MERCAPTOPURINE, AND BENEFIBER. FOLLOW UP IN 6 MOS.

## 2017-12-24 NOTE — Progress Notes (Signed)
Noted  

## 2017-12-24 NOTE — Progress Notes (Signed)
cc'ed to pcp °

## 2018-01-05 ENCOUNTER — Other Ambulatory Visit: Payer: Self-pay | Admitting: Gastroenterology

## 2018-01-12 ENCOUNTER — Other Ambulatory Visit: Payer: Self-pay | Admitting: Gastroenterology

## 2018-02-10 ENCOUNTER — Other Ambulatory Visit: Payer: Self-pay | Admitting: Gastroenterology

## 2018-04-18 ENCOUNTER — Telehealth: Payer: Self-pay

## 2018-04-18 NOTE — Telephone Encounter (Signed)
Pt's mom, Vickie, called and had questions about a nutritional drink. She said after he completed his weekly Vitamin D, she started him on [place]. It comes in different flavors and pt drinks one daily to one every other day.  She said she has been giving it to him several months and paying for it. However, the pt has a special needs trust that might would pay for this drink if recommended by a physician. She just wants to know whenever [name] returns from vacation, if she would approve this. It has 10 gm of protein, 27 vitamins and 8 B vitamins.  She said if she does approve, if she could write it out on Rx pad and she would come by office and pick it up and take care of it.

## 2018-04-22 ENCOUNTER — Telehealth: Payer: Self-pay

## 2018-04-22 NOTE — Telephone Encounter (Signed)
Pt's mother called with a very concerned voice stating that her son has a fever x 2 days and needs to be seen by someone. Pt doesn't have a PCP and she states pt can't be prescribed antibiotics since he has chron's and other medical problems.    Spoke with AB and pt should be seen by a PCP or urgent care to r/o flu, viral ect. pts mother is aware and in agreement of this.

## 2018-04-25 NOTE — Telephone Encounter (Signed)
Noted  

## 2018-04-27 ENCOUNTER — Telehealth: Payer: Self-pay | Admitting: Gastroenterology

## 2018-04-27 MED ORDER — AEROCHAMBER MV MISC: 0 refills | Status: AC

## 2018-04-27 MED ORDER — BOOST PO LIQD: ORAL | 3 refills | Status: DC

## 2018-04-27 MED ORDER — BENZONATATE 100 MG PO CAPS: ORAL_CAPSULE | ORAL | 1 refills | Status: DC

## 2018-04-27 NOTE — Telephone Encounter (Addendum)
PT HAD FEVER FEB 14: T 102.4 AT URGENT CARE. GIVEN TESSALON PERLES AND SYMBICORT. USING TYLENOL LIQUID COLD MEDICINE. NO FEVER SINCE SAT. NO SPACER GIVEN WITH INH. NEED REFILLS ON TESSALON PERLES AND AERO CHAMBER. RX SENT.  SEEN IN CLINIC, NAD, DRY NONPRODUCTIVE COUGH. LUNGS CLEAR, ABD: BENIGN.  PLAN: 1. TESSALON PERLES THREE TIMES A DAY FOR 7 DAYS THEN AS NEEDED FOR COUGH 2. SYMBICORT WITH A SPACER THREE TIMES A A DAY FOR 2 WEEKS 3. DRINK WATER TO KEEP YOUR URINE LIGHT YELLOW.  PLEASE CALL WITH QUESTIONS OR CONCERNS.

## 2018-04-27 NOTE — Telephone Encounter (Signed)
Tanner Bradley is aware and will pick up Rx at front desk.

## 2018-04-27 NOTE — Telephone Encounter (Signed)
REVIEWED-NO ADDITIONAL RECOMMENDATIONS. 

## 2018-04-27 NOTE — Telephone Encounter (Signed)
RX PRINTED AND AVAILABLE FOR PICKUP.

## 2018-04-27 NOTE — Addendum Note (Signed)
Addended by: Danie Binder on: 04/27/2018 04:03 PM   Modules accepted: Orders

## 2018-04-27 NOTE — Telephone Encounter (Signed)
I called mom and told her [name] was at lunch when she called and has not had time to see the new message yet.

## 2018-04-27 NOTE — Telephone Encounter (Signed)
Patient mother called back inquiring if you heard from slf

## 2018-04-27 NOTE — Telephone Encounter (Signed)
See note of 04/22/2018.

## 2018-04-27 NOTE — Addendum Note (Signed)
Addended by: Danie Binder on: 04/27/2018 11:33 AM   Modules accepted: Orders

## 2018-04-27 NOTE — Telephone Encounter (Signed)
Pt's mom said they took pt to the Urgent Care when he was sick. He was given Symbicort inhaler, Tessalon pearls and she has been giving him Liquid tylenol.  He has not had a fever since Friday, but it is hard to shake the cough. She asked if [name] could advise on something else. She said she would be glad to bring him in if [name] could just see him a few min. Please advise!

## 2018-04-27 NOTE — Telephone Encounter (Signed)
I spoke with [name] and she said she is not qualified to manage coughs and it appears pt is being cared for appropriately.  However, she will take a look at him if she has him here before 4:00 pm.  Loletha Carrow is aware and will bring pt by.

## 2018-05-27 ENCOUNTER — Telehealth: Payer: Self-pay

## 2018-05-27 MED ORDER — BUDESONIDE-FORMOTEROL FUMARATE 80-4.5 MCG/ACT IN AERO: 2.0000 | INHALATION_SPRAY | Freq: Two times a day (BID) | RESPIRATORY_TRACT | 0 refills | Status: DC

## 2018-05-27 NOTE — Telephone Encounter (Signed)
PLEASE CALL PT. Rx sent.

## 2018-05-27 NOTE — Addendum Note (Signed)
Addended by: Danie Binder on: 05/27/2018 12:32 PM   Modules accepted: Orders

## 2018-05-27 NOTE — Telephone Encounter (Signed)
Pt's mom called and requests refill on Symbicort. Pt is doing well, just occasional cough and she wants it refilled before he runs out.  Forwarding to [name].

## 2018-05-30 NOTE — Telephone Encounter (Signed)
Pt said he did get his prescription.

## 2018-06-07 ENCOUNTER — Telehealth: Payer: Self-pay | Admitting: Gastroenterology

## 2018-06-07 NOTE — Telephone Encounter (Signed)
Pt's mother, Loletha Carrow, called asking about Mister's appt with SF next week. She said that they didn't have a computer/camera to do ZOOM, but is willing to do a Phone Visit. Please advise how you want me to schedule his appointment and I will call the mother back. [phone] (She also had questions about his medications)

## 2018-06-07 NOTE — Telephone Encounter (Signed)
I changed OV to a Telephone VIsit

## 2018-06-07 NOTE — Telephone Encounter (Signed)
Tanner Bradley is aware. She will await a call back to to schedule the phone visit. She just wanted to make sure that pt could get prescriptions filled when he was seen. I told him that Dr. [name] will take care of his scripts as normal.

## 2018-06-07 NOTE — Telephone Encounter (Signed)
PLEASE CALL PT'S MOTHER. PT CAN HAVE A PHONE VISIT.

## 2018-06-15 ENCOUNTER — Other Ambulatory Visit: Payer: Self-pay

## 2018-06-15 ENCOUNTER — Ambulatory Visit (INDEPENDENT_AMBULATORY_CARE_PROVIDER_SITE_OTHER): Payer: Medicaid Other | Admitting: Gastroenterology

## 2018-06-15 DIAGNOSIS — M8580 Other specified disorders of bone density and structure, unspecified site: Secondary | ICD-10-CM

## 2018-06-15 DIAGNOSIS — T380X5A Adverse effect of glucocorticoids and synthetic analogues, initial encounter: Secondary | ICD-10-CM

## 2018-06-15 DIAGNOSIS — D649 Anemia, unspecified: Secondary | ICD-10-CM | POA: Diagnosis not present

## 2018-06-15 DIAGNOSIS — K5010 Crohn's disease of large intestine without complications: Secondary | ICD-10-CM

## 2018-06-15 MED ORDER — MERCAPTOPURINE 50 MG PO TABS: ORAL_TABLET | ORAL | 3 refills | Status: DC

## 2018-06-15 MED ORDER — BUDESONIDE-FORMOTEROL FUMARATE 80-4.5 MCG/ACT IN AERO: 2.0000 | INHALATION_SPRAY | Freq: Two times a day (BID) | RESPIRATORY_TRACT | 3 refills | Status: DC

## 2018-06-15 MED ORDER — BENZONATATE 100 MG PO CAPS: ORAL_CAPSULE | ORAL | 5 refills | Status: DC

## 2018-06-15 NOTE — Assessment & Plan Note (Signed)
TAKING VIATAMIN D 2000 IU DAILY.    RECHECK LABS AFTER JUN 1.

## 2018-06-15 NOTE — Assessment & Plan Note (Signed)
SYMPTOMS CONTROLLED/RESOLVED.  USE SYMBICORT AND TESSALON PERLES WHEN NEEDED. REFILLS COMPLETE. CONTINUE MERCAPTOPURINE AND VITAMIN D. CHECK LABS AFTER JUN 1. FOLLOW UP IN 6 MOS.

## 2018-06-15 NOTE — Progress Notes (Addendum)
Subjective:    Patient ID: Tanner Bradley, male    DOB: 1990-08-14, 28 y.o.   MRN: [id number]   Primary Care Physician:  [name], MD  Primary GI:  Barney Drain, MD   Patient Location: home   Provider Location: [facility] office   Reason for Visit:    Persons present on the virtual encounter, with roles: patient, myself (provider), MARTINA BOOTH CMA (update meds/allergies)   Total time (minutes) spent on medical discussion:  10 MINUTES   Due to COVID-19, visit was VIA TELEPHONE VISIT DUE TO COVID 19. VISIT IS CONDUCTED VIRTUALLY AND WAS REQUESTED BY PATIENT.   Virtual Visit via TELEPHONE   I connected with  Theo Yasuda AND VICKY RUBLE  and verified that I am speaking with the correct person using two identifiers.   I discussed the limitations, risks, security and privacy concerns of performing an evaluation and management service by telephone/video and the availability of in person appointments. I also discussed with the patient that there may be a patient responsible charge related to this service. The patient expressed understanding and agreed to proceed.  [name], MD  HPI PT DOING WELL AFTER SYMBICORT. MAY NEED REFILLS IN THIS COVID 19 CRAZINESS. 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 REPLACED 95% OF DUCTS AND FULL OF STUFF. WEIGHS: 156.3 LBS  MAY HAVE COUGH BROUGHT ON BY ALLERGENS: WOOD BURNING. BMs: DOING GOOD:1-2/DAY. SAW DR. Merlene Laughter & HE CAN TAKE 2 MORE YEARS THEN TAPER.   PT DENIES FEVER, CHILLS, HEMATOCHEZIA, HEMATEMESIS, nausea, vomiting, melena, diarrhea, CHEST PAIN, SHORTNESS OF BREATH, CHANGE IN BOWEL IN HABITS, constipation, abdominal pain, problems swallowing, OR heartburn or indigestion.  Past Medical History:  Diagnosis Date  . C. difficile colitis NOV 2013  . Cerebral palsy ([place])   . Crohn's disease ([place]) 2008 TCS POS SBFT NL   Rx: Prednisone then 6-MP  . Osteopenia   . Seizures ([place]) 1992  . Vitamin D deficiency     Past  Surgical History:  Procedure Laterality Date  . COLONOSCOPY  2008   COLITIS, bX: POS GRANULOMA-->CD    Allergies  Allergen Reactions  . Other Other (See Comments)    Certain antibiotics CAN NOT be taken due to Crohn's disease   . Amoxicillin Rash   Current Outpatient Medications  Medication Sig    . acetaminophen (TYLENOL) 500 MG tablet Take 500 mg by mouth as needed for pain.    . benzonatate (TESSALON PERLES) 100 MG capsule 1 PO TID FOR 7 DAYS WHEN WHEN NEEDED FOR COUGH    . budesonide-formoterol (SYMBICORT) 80-4.5 MCG/ACT inhaler Inhale 2 puffs into the lungs 2 (two) times daily. FOR 7 DAYS THEN 2 PUFFS DAILY FOR 7 DAYS    . calcium citrate (CALCITRATE - DOSED IN MG ELEMENTAL CALCIUM) 950 MG tablet Take 2 tablets by mouth daily.    . cholecalciferol (VITAMIN D) 1000 units tablet Take 2,000 Units by mouth daily.    . folic acid (FOLVITE) 1 MG tablet TAKE 1 TABLET BY MOUTH ONCE DAILY    . lactose free nutrition (BOOST) LIQD 1 PO DAILY (Patient taking differently: as needed. 1 PO DAILY)    . levETIRAcetam (KEPPRA) 500 MG tablet Take 1 tablet (500 mg total) by mouth 2 (two) times daily.    . mercaptopurine (PURINETHOL) 50 MG tablet TAKE 1 TABLET BY MOUTH ONCE DAILY(GIVE ON AN EMPTY STOMACH 1 HOUR BEFORE OR 2 HOURS AFTER MEALS)    . polyethylene glycol  powder (GLYCOLAX/MIRALAX) powder Take 17 g by mouth daily. (Patient taking differently: Take 17 g by mouth as needed. )    . Probiotic CAPS 1 PO DAILY.    Marland Kitchen Spacer/Aero-Holding Chambers (AEROCHAMBER MV) inhaler Use WITH INHALER (Patient taking differently: as needed. Use WITH INHALER)    .      Marland Kitchen Wheat Dextrin (BENEFIBER) POWD 1 SCOOP PO TID (Patient taking differently: as needed. 1 SCOOP PO TID)     Review of Systems PER HPI OTHERWISE ALL SYSTEMS ARE NEGATIVE.    Objective:   Physical Exam  TELEPHONE VISIT DUE TO COVID 19, VISIT IS CONDUCTED VIRTUALLY AND WAS REQUESTED BY PATIENT.      Assessment & Plan:

## 2018-06-15 NOTE — Patient Instructions (Addendum)
USE SYMBICORT AND TESSALON PERLES WHEN NEEDED. REFILLS COMPLETE.  CONTINUE MERCAPTOPURINE AND VITAMIN D.  CHECK LABS AFTER JUN 1.  FOLLOW UP IN 6 MOS.

## 2018-06-15 NOTE — Assessment & Plan Note (Signed)
NO BRBPR OR MELENA.  CONTINUE TO MONITOR SYMPTOMS. CBC AFTER JUN 1.

## 2018-06-16 NOTE — Progress Notes (Signed)
cc'ed to pcp °

## 2018-06-16 NOTE — Progress Notes (Signed)
ON RECALL  °

## 2018-08-09 ENCOUNTER — Telehealth: Payer: Self-pay | Admitting: Gastroenterology

## 2018-08-09 LAB — COMPLETE METABOLIC PANEL WITH GFR
AG Ratio: 1.3 (calc) (ref 1.0–2.5)
ALT: 10 U/L (ref 9–46)
AST: 15 U/L (ref 10–40)
Albumin: 3.9 g/dL (ref 3.6–5.1)
Alkaline phosphatase (APISO): 94 U/L (ref 36–130)
BUN: 8 mg/dL (ref 7–25)
CO2: 28 mmol/L (ref 20–32)
Calcium: 9.2 mg/dL (ref 8.6–10.3)
Chloride: 104 mmol/L (ref 98–110)
Creat: 0.86 mg/dL (ref 0.60–1.35)
GFR, Est African American: 137 mL/min/{1.73_m2} (ref >=60)
GFR, Est Non African American: 118 mL/min/{1.73_m2} (ref >=60)
Globulin: 3.1 g/dL (calc) (ref 1.9–3.7)
Glucose, Bld: 89 mg/dL (ref 65–139)
Potassium: 4.9 mmol/L (ref 3.5–5.3)
Sodium: 140 mmol/L (ref 135–146)
Total Bilirubin: 0.3 mg/dL (ref 0.2–1.2)
Total Protein: 7 g/dL (ref 6.1–8.1)

## 2018-08-09 LAB — CBC WITH DIFFERENTIAL/PLATELET
Absolute Monocytes: 847 cells/uL (ref 200–950)
Basophils Absolute: 77 cells/uL (ref 0–200)
Basophils Relative: 1.1 %
Eosinophils Absolute: 161 cells/uL (ref 15–500)
Eosinophils Relative: 2.3 %
HCT: 40.5 % (ref 38.5–50.0)
Hemoglobin: 11.9 g/dL — ABNORMAL LOW (ref 13.2–17.1)
Lymphs Abs: 1631 cells/uL (ref 850–3900)
MCH: 23.3 pg — ABNORMAL LOW (ref 27.0–33.0)
MCHC: 29.4 g/dL — ABNORMAL LOW (ref 32.0–36.0)
MCV: 79.3 fL — ABNORMAL LOW (ref 80.0–100.0)
MPV: 11.2 fL (ref 7.5–12.5)
Monocytes Relative: 12.1 %
Neutro Abs: 4284 cells/uL (ref 1500–7800)
Neutrophils Relative %: 61.2 %
Platelets: 656 10*3/uL — ABNORMAL HIGH (ref 140–400)
RBC: 5.11 10*6/uL (ref 4.20–5.80)
RDW: 17.4 % — ABNORMAL HIGH (ref 11.0–15.0)
Total Lymphocyte: 23.3 %
WBC: 7 10*3/uL (ref 3.8–10.8)

## 2018-08-09 LAB — MEASLES/MUMPS/RUBELLA IMMUNITY
Mumps IgG: 61.7 AU/mL
Rubella: 1.83 index
Rubeola IgG: 84.2 AU/mL

## 2018-08-09 LAB — VITAMIN D 25 HYDROXY (VIT D DEFICIENCY, FRACTURES): Vit D, 25-Hydroxy: 30 ng/mL (ref 30–100)

## 2018-08-09 LAB — VARICELLA ZOSTER ANTIBODY, IGG: Varicella IgG: 720.6 index

## 2018-08-09 LAB — VITAMIN B12: Vitamin B-12: 280 pg/mL (ref 200–1100)

## 2018-08-09 MED ORDER — VITAMIN D 25 MCG (1000 UNIT) PO TABS: 3000.0000 [IU] | ORAL_TABLET | Freq: Every day | ORAL | 11 refills | Status: AC

## 2018-08-09 NOTE — Telephone Encounter (Signed)
PT's mom is aware. She said to let [name] know that he eats meats everyday, and more than one serving many times. They eat a lot of hamburger, chicken and ham. She said to please let her know if there is anything else she can do .

## 2018-08-09 NOTE — Telephone Encounter (Signed)
PLEASE CALL PT'S MOTHER. HIS LIVER PANEL IS NORMAL. HIS VITAMIN D LEVEL IS NORMAL BUT ON THE LOWER SIDE. HIS Hb IS 11.9 DOWN FROM 14.   HE SHOULD MAKE SURE HE EATS A PALM SIZE SERVING OF MEAT AT LEAST ONCE A DAY.  HE SHOULD INCREASE VITAMIN D TO 3000 UNITS DAILY.  OPV IN OCT 2020.

## 2018-08-10 NOTE — Telephone Encounter (Signed)
PLEASE CALL PT'S MOM. THEY ARE DOING ALL THE RIGHT THINGS. HIS BLOOD COUNT WILL GET BETTER.

## 2018-08-10 NOTE — Telephone Encounter (Signed)
Tanner Bradley is aware.

## 2018-08-10 NOTE — Telephone Encounter (Signed)
recall

## 2018-08-24 ENCOUNTER — Telehealth: Payer: Self-pay

## 2018-08-24 NOTE — Telephone Encounter (Signed)
Called spoke with patient mom Olegario Shearer, advised not showing any referrals to [facility]. Had to review chart to try to find a date. Phone note dated in 2013 states [facility] wanted patient to be referred to Dr. Dorris Fetch. I advised her of this and advised I do not see the actual referral in the system

## 2018-08-24 NOTE — Telephone Encounter (Signed)
Pt's mom called and said the Medical Review will be soon on pt. She needs for her record to know when pt was first referred to Dr. Dorris Fetch. Pt saw him for many years prior to changing to Dr. In [place]. Mindy, can you see when first referral was done?

## 2018-08-24 NOTE — Telephone Encounter (Signed)
Noted  

## 2018-11-08 ENCOUNTER — Other Ambulatory Visit: Payer: Self-pay | Admitting: Gastroenterology

## 2018-12-14 ENCOUNTER — Ambulatory Visit: Payer: Medicaid Other | Admitting: Gastroenterology

## 2018-12-14 ENCOUNTER — Encounter: Payer: Self-pay | Admitting: Gastroenterology

## 2018-12-14 ENCOUNTER — Other Ambulatory Visit: Payer: Self-pay

## 2018-12-14 DIAGNOSIS — K5010 Crohn's disease of large intestine without complications: Secondary | ICD-10-CM

## 2018-12-14 NOTE — Progress Notes (Signed)
ON RECALL  °

## 2018-12-14 NOTE — Progress Notes (Signed)
Subjective:    Patient ID: Tanner Bradley, male    DOB: [birth year], 28 y.o.   MRN: [id number] [name], MD  HPI BEEN MINING FOR GOLD, RUBIES, AND DIAMONDS. LAST VITAMIN B12/D ARE NORMAL. BMs: OK(#4). SAW AUG 2020 DR. DOONQUAH & THINKING ABOUT GETTING OFF MEDICINES.  PT DENIES FEVER, CHILLS, HEMATOCHEZIA, HEMATEMESIS, nausea, vomiting, melena, diarrhea, CHEST PAIN, SHORTNESS OF BREATH,  CHANGE IN BOWEL IN HABITS, constipation, abdominal pain, problems swallowing, OR heartburn or indigestion.  Past Medical History:  Diagnosis Date  . C. difficile colitis NOV 2013  . Cerebral palsy ([place])   . Crohn's disease ([place]) 2008 TCS POS SBFT NL   Rx: Prednisone then 6-MP  . Osteopenia   . Seizures ([place]) 1992  . Vitamin D deficiency    Past Surgical History:  Procedure Laterality Date  . COLONOSCOPY  2008   COLITIS, bX: POS GRANULOMA-->CD    Allergies  Allergen Reactions  . Other Other (See Comments)    Certain antibiotics CAN NOT be taken due to Crohn's disease   . Amoxicillin Rash    Current Outpatient Medications  Medication Sig    . TYLENOL 500 MG tablet Take 500 mg by mouth as needed for pain.    . TESSALON PERLES 100 MG 1 PO TID WHEN WHEN NEEDED FOR COUGH    . SYMBICORT 80-4.5 MCG/ACT inhaler Inhale 2 puffs into the lungs 2 (two) times daily FOR 7 DAYS THEN 2 PUFFS DAILY FOR 7 DAYS    . calcium citrate 950 MG tablet Take 2 tablets by mouth daily.    . cholecalciferol (VITAMIN D3) 25 MCG (1000 UT) tablet Take 3 tablets (3,000 Units total) by mouth daily.    . folic acid (FOLVITE) 1 MG tablet TAKE 1 TABLET(1 MG) BY MOUTH DAILY    . lactose free nutrition (BOOST) LIQD 1 PO DAILY    . KEPPRA 500 MG tablet Take 500 mg by mouth 2 (two) times daily.    . mercaptopurine (PURINETHOL) 50 MG tablet TAKE 1 TABLET BY MOUTH ONCE DAILY(GIVE ON AN EMPTY STOMACH 1 HOUR BEFORE OR 2 HOURS AFTER MEALS)    . GLYCOLAX/MIRALAX powder Take 17 g by mouth as needed.     . Probiotic CAPS 1 PO DAILY.     Marland Kitchen Spacer/Aero-Holding Chambers (AEROCHAMBER MV) inhaler Use WITH INHALER (Patient taking differently: as needed. Use WITH INHALER)    . Wheat Dextrin (BENEFIBER) POWD  1 SCOOP PO TID AS NEEDED    .      Marland Kitchen       Review of Systems PER HPI OTHERWISE ALL SYSTEMS ARE NEGATIVE.    Objective:   Physical Exam Vitals signs reviewed.  Constitutional:      General: He is not in acute distress.    Appearance: He is well-developed.  HENT:     Head: Normocephalic and atraumatic.     Mouth/Throat:     Comments: MASK IN PLACE Eyes:     General: No scleral icterus.    Pupils: Pupils are equal, round, and reactive to light.  Neck:     Musculoskeletal: Normal range of motion and neck supple.  Cardiovascular:     Rate and Rhythm: Normal rate and regular rhythm.     Heart sounds: Normal heart sounds.  Pulmonary:     Effort: Pulmonary effort is normal. No respiratory distress.     Breath sounds: Normal breath sounds.  Abdominal:     General: Bowel sounds are normal. There is no  distension.     Palpations: Abdomen is soft.     Tenderness: There is no abdominal tenderness.  Musculoskeletal:     Right lower leg: No edema.     Left lower leg: No edema.  Lymphadenopathy:     Cervical: No cervical adenopathy.  Skin:    General: Skin is warm and dry.  Neurological:     Mental Status: He is alert and oriented to person, place, and time.     Comments: NO FOCAL DEFICITS  Psychiatric:        Mood and Affect: Mood normal.     Comments: NORMAL AFFECT       Assessment & Plan:

## 2018-12-14 NOTE — Patient Instructions (Addendum)
CONTINUE MERCAPTOPURINE.  USE MIRALAX OR BENEFIBER TO PREVENT CONSTIPATION.  CONTINUE PROBIOTIC.  FOLLOW UP IN [facility] 2021. YOU WILL NEED REPEAT VITAMIN D/B12, BLOOD COUNT AND LIVER PANEL ONE WEEK PRIOR TO YOUR NEXT VISIT.

## 2018-12-14 NOTE — Assessment & Plan Note (Addendum)
SYMPTOMS CONTROLLED/RESOLVED.  CONTINUE MERCAPTOPURINE. USE MIRALAX OR BENEFIBER TO PREVENT CONSTIPATION. CONTINUE PROBIOTIC. REPEAT VITAMIN D/B12, BLOOD COUNT AND LIVER PANEL ONE WEEK PRIOR TO YOUR NEXT VISIT. FOLLOW UP IN [facility] 2021.

## 2018-12-21 ENCOUNTER — Other Ambulatory Visit: Payer: Self-pay | Admitting: Gastroenterology

## 2019-04-12 ENCOUNTER — Encounter: Payer: Self-pay | Admitting: Gastroenterology

## 2019-04-12 ENCOUNTER — Ambulatory Visit: Payer: Medicaid Other | Admitting: Gastroenterology

## 2019-04-12 ENCOUNTER — Other Ambulatory Visit: Payer: Self-pay

## 2019-04-12 ENCOUNTER — Other Ambulatory Visit: Payer: Self-pay | Admitting: Gastroenterology

## 2019-04-12 DIAGNOSIS — K5010 Crohn's disease of large intestine without complications: Secondary | ICD-10-CM

## 2019-04-12 MED ORDER — MERCAPTOPURINE 50 MG PO TABS: ORAL_TABLET | ORAL | 3 refills | Status: DC

## 2019-04-12 MED ORDER — BUDESONIDE-FORMOTEROL FUMARATE 80-4.5 MCG/ACT IN AERO: 2.0000 | INHALATION_SPRAY | Freq: Two times a day (BID) | RESPIRATORY_TRACT | 11 refills | Status: AC

## 2019-04-12 MED ORDER — BENZONATATE 100 MG PO CAPS: ORAL_CAPSULE | ORAL | 5 refills | Status: DC

## 2019-04-12 MED ORDER — FOLIC ACID 1 MG PO TABS: ORAL_TABLET | ORAL | 3 refills | Status: DC

## 2019-04-12 NOTE — Assessment & Plan Note (Signed)
SYMPTOMS CONTROLLED/RESOLVED.  Continue MERCAPTOPURINE AND FOLIC ACID. Please CALL or SEND me A MY CHART MESSAGE IF YOU HAVE QUESTIONS OR CONCERNS. GET CBC/CMP IN AUG 2021 ONE WEEK PRIOR TO OFFICE VISIT. FOLLOW UP IN 6 MOS WITH [facility]IE LEWIS.

## 2019-04-12 NOTE — Progress Notes (Signed)
Subjective:    Patient ID: Tanner Bradley, male    DOB: Aug 14, 1990, 29 y.o.   MRN: [id number]  Tanner Percy, MD  HPI EATING GOOD. WEIGHT STABLE AT 158 LBS. No questions or concerns. NEEDS REFILLS ON MEDS.  PT DENIES FEVER, CHILLS, HEMATOCHEZIA, HEMATEMESIS, nausea, vomiting, melena, diarrhea, CHEST PAIN, SHORTNESS OF BREATH,  CHANGE IN BOWEL IN HABITS, constipation, abdominal pain, problems swallowing, OR heartburn or indigestion.  Past Medical History:  Diagnosis Date  . C. difficile colitis NOV 2013  . Cerebral palsy ([place])   . Crohn's disease ([place]) 2008 TCS POS SBFT NL   Rx: Prednisone then 6-MP  . Osteopenia   . Seizures ([place]) 1992  . Vitamin D deficiency     Past Surgical History:  Procedure Laterality Date  . COLONOSCOPY  2008   COLITIS, bX: POS GRANULOMA-->CD   Allergies  Allergen Reactions  . Other Other (See Comments)    Certain antibiotics CAN NOT be taken due to Crohn's disease   . Amoxicillin Rash    Current Outpatient Medications  Medication Sig    . acetaminophen (TYLENOL) 500 MG tablet Take 500 mg by mouth as needed for pain.    . benzonatate (TESSALON PERLES) 100 MG capsule 1 PO TID FOR 7 DAYS WHEN WHEN NEEDED FOR COUGH    . budesonide-formoterol (SYMBICORT) 80-4.5 MCG/ACT inhaler Inhale 2 puffs into the lungs 2 (two) times daily. FOR 7 DAYS THEN 2 PUFFS DAILY FOR 7 DAYS    . calcium citrate (CALCITRATE - DOSED IN MG ELEMENTAL CALCIUM) 950 MG tablet Take 2 tablets by mouth daily.    . cholecalciferol (VITAMIN D3) 25 MCG (1000 UT) tablet Take 3 tablets (3,000 Units total) by mouth daily.    . folic acid (FOLVITE) 1 MG tablet TAKE 1 TABLET(1 MG) BY MOUTH DAILY    . lactose free nutrition (BOOST) LIQD 1 PO DAILY (Patient taking differently: as needed. 1 PO DAILY)    . levETIRAcetam (KEPPRA) 500 MG tablet Take 500 mg by mouth 2 (two) times daily.    . mercaptopurine (PURINETHOL) 50 MG tablet TAKE 1 TABLET EVERY DAY ON EMPTY STOMACH    . GLYCOLAX/MIRALAX  powder 17 g by mouth as needed.     . Probiotic CAPS 1 PO DAILY.    .      . Vitamin D, DRISDOL) 50000 units CAPS capsule Take 50,000 Units by mouth as needed.    . BENEFIBER POWD 1 SCOOP PO TID    . KEPPRA 500 MG tablet Take 1 tablet (500 mg total) by mouth 2 (two) times daily.      Review of Systems PER HPI OTHERWISE ALL SYSTEMS ARE NEGATIVE.    Objective:   Physical Exam Constitutional:      General: He is not in acute distress.    Appearance: Normal appearance.  HENT:     Mouth/Throat:     Comments: MASK IN PLACE Eyes:     General: No scleral icterus.    Pupils: Pupils are equal, round, and reactive to light.  Cardiovascular:     Rate and Rhythm: Normal rate and regular rhythm.     Pulses: Normal pulses.     Heart sounds: Normal heart sounds.  Pulmonary:     Effort: Pulmonary effort is normal.     Breath sounds: Normal breath sounds.  Abdominal:     General: Bowel sounds are normal.     Palpations: Abdomen is soft.     Tenderness: There is no  abdominal tenderness.  Musculoskeletal:     Cervical back: Normal range of motion.     Right lower leg: No edema.     Left lower leg: No edema.  Lymphadenopathy:     Cervical: No cervical adenopathy.  Skin:    General: Skin is warm and dry.  Neurological:     Mental Status: He is alert and oriented to person, place, and time.     Comments: NO  NEW FOCAL DEFICITS  Psychiatric:        Mood and Affect: Mood normal.     Comments: NORMAL AFFECT       Assessment & Plan:

## 2019-04-12 NOTE — Progress Notes (Signed)
Cc'ed to pcp °

## 2019-04-12 NOTE — Patient Instructions (Signed)
Continue MERCAPTOPURINE AND FOLIC ACID.  Please CALL or SEND me A MY CHART MESSAGE IF YOU HAVE QUESTIONS OR CONCERNS.  GET CBC/CMP IN AUG 2021 ONE WEEK PRIOR TO OFFICE VISIT.  FOLLOW UP IN 6 MOS WITH LESLIE LEWIS.

## 2019-09-14 ENCOUNTER — Telehealth: Payer: Self-pay | Admitting: Internal Medicine

## 2019-09-14 NOTE — Telephone Encounter (Signed)
Spoke with pts mother. Pts mother has spoken with the pharmacy and everything has been taken care of.

## 2019-09-14 NOTE — Telephone Encounter (Signed)
[place] ABOUT HIS MEDICATIONS

## 2019-10-09 NOTE — Progress Notes (Signed)
Primary Care Physician: [name], MD  Primary Gastroenterologist:  Formerly Barney Drain, MD   Chief Complaint  Patient presents with  . Follow-up    HPI: Tanner Bradley is a 29 y.o. male here for follow up Crohn's colitis.  Patient was last seen in February 2021.  He has a history of cerebral palsy, Crohn's disease, seizure disorder.  Has been on mercaptopurine and folic acid.  Needs updated CBC, c-Met. Did not have orders so has not completed. He is feeling well. Appetite good. No abdominal. BM once daily, [place] 4. No blood in stool. No heartburn. No N/V. Mom with him today. Pleased with how he has been doing over the past one year. Has "good healthy" weight on him not per Mom. She is wondering about why Dr. Merlene Laughter changed from giving him Keppra for 3 years to 5 years. States [name] had tried to reach out to him before she left but never heard back. States they were told it is still precautionary but she is worried about long term side effects. Last seizure was 2019 and before that he had not had any since he was a baby.   He has had only one colonoscopy and that was at time of diagnosis of Crohn's colitis in 2008. we discussed need for colon cancer screening and he and mom are not interested at this time.    Current Outpatient Medications  Medication Sig Dispense Refill  . acetaminophen (TYLENOL) 500 MG tablet Take 500 mg by mouth as needed for pain.    . benzonatate (TESSALON PERLES) 100 MG capsule 1 PO TID FOR 7 DAYS WHEN WHEN NEEDED FOR COUGH 60 capsule 5  . budesonide-formoterol (SYMBICORT) 80-4.5 MCG/ACT inhaler Inhale 2 puffs into the lungs 2 (two) times daily. FOR 7 DAYS THEN 2 PUFFS DAILY FOR 7 DAYS 1 Inhaler 11  . calcium citrate (CALCITRATE - DOSED IN MG ELEMENTAL CALCIUM) 950 MG tablet Take 2 tablets by mouth daily.    . cholecalciferol (VITAMIN D3) 25 MCG (1000 UT) tablet Take 3 tablets (3,000 Units total) by mouth daily. 90 tablet 11  . folic acid  (FOLVITE) 1 MG tablet TAKE 1 TABLET(1 MG) BY MOUTH DAILY 90 tablet 3  . levETIRAcetam (KEPPRA) 500 MG tablet Take 500 mg by mouth 2 (two) times daily.    . mercaptopurine (PURINETHOL) 50 MG tablet TAKE 1 TABLET EVERY DAY ON EMPTY STOMACH 90 tablet 3  . polyethylene glycol powder (GLYCOLAX/MIRALAX) powder Take 17 g by mouth daily. (Patient taking differently: Take 17 g by mouth as needed. ) 527 g 3  . Probiotic CAPS 1 PO DAILY. 30 capsule 11  . Spacer/Aero-Holding Chambers (AEROCHAMBER MV) inhaler Use WITH INHALER (Patient taking differently: as needed. Use WITH INHALER) 1 each 0  . Wheat Dextrin (BENEFIBER) POWD 1 SCOOP PO TID (Patient taking differently: as needed. 1 SCOOP PO TID) 730 g 11   No current facility-administered medications for this visit.    Allergies as of 10/10/2019 - Review Complete 10/10/2019  Allergen Reaction Noted  . Other Other (See Comments) 01/08/2013  . Amoxicillin Rash 10/16/2010    ROS:  General: Negative for anorexia, weight loss, fever, chills, fatigue, weakness. ENT: Negative for hoarseness, difficulty swallowing , nasal congestion. CV: Negative for chest pain, angina, palpitations, dyspnea on exertion, peripheral edema.  Respiratory: Negative for dyspnea at rest, dyspnea on exertion, cough, sputum, wheezing.  GI: See history of present illness. GU:  Negative for dysuria, hematuria, urinary incontinence,  urinary frequency, nocturnal urination.  Endo: Negative for unusual weight change.    Physical Examination:   BP 122/87   Pulse (!) 110   Temp (!) 97.3 F (36.3 C)   Ht _0  (1.651 m)   Wt 155 lb 3.2 oz (70.4 kg)   BMI 25.83 kg/m   General: Well-nourished, well-developed in no acute distress.  Eyes: No icterus. Mouth: masked Lungs: Clear to auscultation bilaterally.  Heart: Regular rate and rhythm, no murmurs rubs or gallops.  Abdomen: Bowel sounds are normal, nontender, nondistended, no hepatosplenomegaly or masses, no abdominal bruits or  hernia , no rebound or guarding.   Extremities: No lower extremity edema. No clubbing or deformities. Neuro: Alert and oriented x 4   Skin: Warm and dry, no jaundice.   Psych: Alert and cooperative, normal mood and affect.   Imaging Studies: No results found.   Impression/Plan:  29 y/o male with history of seizure d/o, Cerebral palsy, Crohn's colitis (diagnosed in 2008) presenting for follow up. Patient is doing well. Crohn's is in remission. He is due for labs (CBC, CMET) which we will get today. He is not interested in pursuing colonoscopy for colon cancer screening. He is at increased risk given extent of involvement of his colon with Crohn's. We will reassess at next visit per their requests. I have requested they try to establish care with PCP for non-GI medications that he has been receiving from [name] previously. I did refill tessalon as requested. Currently other medications have adequate refills.   Mom asked that I review Dr. Merlene Laughter records to determine change in management from 3 years of Keppra to 5 years (change in recommendation changed at last OV 04/2019). I will review records.   Return to the office in six months or sooner if needed.

## 2019-10-10 ENCOUNTER — Encounter: Payer: Self-pay | Admitting: Gastroenterology

## 2019-10-10 ENCOUNTER — Other Ambulatory Visit: Payer: Self-pay

## 2019-10-10 ENCOUNTER — Ambulatory Visit: Payer: Medicaid Other | Admitting: Gastroenterology

## 2019-10-10 VITALS — BP 122/87 | HR 110 | Temp 97.3°F | Ht 65.0 in | Wt 155.2 lb

## 2019-10-10 DIAGNOSIS — K5010 Crohn's disease of large intestine without complications: Secondary | ICD-10-CM | POA: Diagnosis not present

## 2019-10-10 LAB — CBC WITH DIFFERENTIAL/PLATELET
Eosinophils Relative: 0.2 %
Hemoglobin: 15.8 g/dL (ref 13.2–17.1)
Lymphs Abs: 1004 cells/uL (ref 850–3900)
MCH: 30.2 pg (ref 27.0–33.0)
Platelets: 524 10*3/uL — ABNORMAL HIGH (ref 140–400)
RBC: 5.24 10*6/uL (ref 4.20–5.80)
RDW: 13.1 % (ref 11.0–15.0)

## 2019-10-10 MED ORDER — BENZONATATE 100 MG PO CAPS: ORAL_CAPSULE | ORAL | 5 refills | Status: DC

## 2019-10-10 NOTE — Patient Instructions (Addendum)
1. I have checked your medications and you have refills available for all the medications except tessalon. I have sent in RX for this. Call if you have any problems obtaining subsequent refills.  2. Please have your labs done. We will contact you with results as available. 3. We will request records from Dr. Merlene Laughter and please also make copies of office visits and any xrays you have available from the past two years for my review. 4. Return to the office in six months or call sooner if needed.

## 2019-10-11 ENCOUNTER — Telehealth: Payer: Self-pay

## 2019-10-11 LAB — COMPREHENSIVE METABOLIC PANEL
AG Ratio: 1.3 (calc) (ref 1.0–2.5)
ALT: 10 U/L (ref 9–46)
AST: 14 U/L (ref 10–40)
Albumin: 4.3 g/dL (ref 3.6–5.1)
Alkaline phosphatase (APISO): 98 U/L (ref 36–130)
BUN: 7 mg/dL (ref 7–25)
CO2: 29 mmol/L (ref 20–32)
Calcium: 9.9 mg/dL (ref 8.6–10.3)
Chloride: 101 mmol/L (ref 98–110)
Creat: 0.83 mg/dL (ref 0.60–1.35)
Globulin: 3.4 g/dL (calc) (ref 1.9–3.7)
Glucose, Bld: 95 mg/dL (ref 65–139)
Potassium: 4.7 mmol/L (ref 3.5–5.3)
Sodium: 140 mmol/L (ref 135–146)
Total Bilirubin: 0.4 mg/dL (ref 0.2–1.2)
Total Protein: 7.7 g/dL (ref 6.1–8.1)

## 2019-10-11 LAB — CBC WITH DIFFERENTIAL/PLATELET
Absolute Monocytes: 470 cells/uL (ref 200–950)
Basophils Absolute: 57 cells/uL (ref 0–200)
Basophils Relative: 0.7 %
Eosinophils Absolute: 16 cells/uL (ref 15–500)
HCT: 48.1 % (ref 38.5–50.0)
MCHC: 32.8 g/dL (ref 32.0–36.0)
MCV: 91.8 fL (ref 80.0–100.0)
MPV: 11.3 fL (ref 7.5–12.5)
Monocytes Relative: 5.8 %
Neutro Abs: 6553 cells/uL (ref 1500–7800)
Neutrophils Relative %: 80.9 %
Total Lymphocyte: 12.4 %
WBC: 8.1 10*3/uL (ref 3.8–10.8)

## 2019-10-11 NOTE — Telephone Encounter (Signed)
Pt's mother saw labs on mychart and wanted to make sure everything was ok. Pts mother is aware that [facility] hasn't reviewed labs from 10/10/19.

## 2019-10-12 ENCOUNTER — Other Ambulatory Visit: Payer: Self-pay

## 2019-10-12 DIAGNOSIS — R7989 Other specified abnormal findings of blood chemistry: Secondary | ICD-10-CM

## 2019-10-12 NOTE — Telephone Encounter (Signed)
Pts mother was notified of lab results. Lab orders placed for 3 months.

## 2019-10-12 NOTE — Telephone Encounter (Signed)
His labs are ok. Platelets a little high but better than last year. We will keep check.  Repeat cbc with diff in 3 months.

## 2019-12-11 ENCOUNTER — Other Ambulatory Visit: Payer: Self-pay | Admitting: *Deleted

## 2019-12-11 ENCOUNTER — Encounter: Payer: Self-pay | Admitting: *Deleted

## 2019-12-11 DIAGNOSIS — R7989 Other specified abnormal findings of blood chemistry: Secondary | ICD-10-CM

## 2019-12-14 ENCOUNTER — Telehealth: Payer: Self-pay | Admitting: Internal Medicine

## 2019-12-14 NOTE — Telephone Encounter (Signed)
I was following up on elevated platelets but we can wait until January. But since we are waiting, we will need CBC and CMET.

## 2019-12-14 NOTE — Telephone Encounter (Signed)
PATIENT MOTHER CALLED AND SAID THAT L[facility]IE WANTED PATIENT TO HAVE LABS IN October AND HE HAD THEM ABOUT 3 MONTHS AGO.  SHE SAID THAT DR. FIELDS WAS OK WITH HIM GOING EVERY 6 MONTHS AND THEY REALLY WANTED TO WAIT UNTIL AFTER THE HOLIDAYS  PLEASE ADVISE ON LESLIES RECOMMENDATIONS

## 2019-12-14 NOTE — Telephone Encounter (Signed)
Routing to Neil Crouch, PA, please advise.

## 2019-12-15 ENCOUNTER — Other Ambulatory Visit: Payer: Self-pay

## 2019-12-15 DIAGNOSIS — D691 Qualitative platelet defects: Secondary | ICD-10-CM

## 2019-12-15 NOTE — Telephone Encounter (Signed)
Noted. Spoke with pts mother. New lab orders were placed and mailed to pt today. Pts mother will take all lab orders and have labs completed as discussed.

## 2020-01-05 ENCOUNTER — Other Ambulatory Visit: Payer: Self-pay | Admitting: Gastroenterology

## 2020-01-08 LAB — CBC WITH DIFFERENTIAL/PLATELET
Monocytes Relative: 8.1 %
Neutrophils Relative %: 68.4 %

## 2020-01-09 LAB — CBC WITH DIFFERENTIAL/PLATELET
MCH: 29.3 pg (ref 27.0–33.0)
Total Lymphocyte: 20.9 %

## 2020-01-09 LAB — TEST AUTHORIZATION

## 2020-01-09 LAB — COMPREHENSIVE METABOLIC PANEL
AG Ratio: 1.3 (calc) (ref 1.0–2.5)
Albumin: 4.3 g/dL (ref 3.6–5.1)
CO2: 29 mmol/L (ref 20–32)
Calcium: 9.3 mg/dL (ref 8.6–10.3)
Sodium: 140 mmol/L (ref 135–146)

## 2020-01-10 ENCOUNTER — Other Ambulatory Visit: Payer: Self-pay

## 2020-01-10 ENCOUNTER — Other Ambulatory Visit: Payer: Self-pay | Admitting: Gastroenterology

## 2020-01-10 LAB — CBC WITH DIFFERENTIAL/PLATELET
Absolute Monocytes: 591 cells/uL (ref 200–950)
Basophils Absolute: 80 cells/uL (ref 0–200)
Basophils Relative: 1.1 %
Eosinophils Absolute: 110 cells/uL (ref 15–500)
Eosinophils Relative: 1.5 %
HCT: 47.1 % (ref 38.5–50.0)
Hemoglobin: 15.1 g/dL (ref 13.2–17.1)
Lymphs Abs: 1526 cells/uL (ref 850–3900)
MCHC: 32.1 g/dL (ref 32.0–36.0)
MCV: 91.3 fL (ref 80.0–100.0)
MPV: 11.3 fL (ref 7.5–12.5)
Neutro Abs: 4993 cells/uL (ref 1500–7800)
Platelets: 568 10*3/uL — ABNORMAL HIGH (ref 140–400)
RBC: 5.16 10*6/uL (ref 4.20–5.80)
RDW: 13 % (ref 11.0–15.0)
WBC: 7.3 10*3/uL (ref 3.8–10.8)

## 2020-01-10 LAB — COMPREHENSIVE METABOLIC PANEL
ALT: 16 U/L (ref 9–46)
AST: 15 U/L (ref 10–40)
Alkaline phosphatase (APISO): 97 U/L (ref 36–130)
BUN/Creatinine Ratio: 7 (calc) (ref 6–22)
BUN: 6 mg/dL — ABNORMAL LOW (ref 7–25)
Chloride: 103 mmol/L (ref 98–110)
Creat: 0.84 mg/dL (ref 0.60–1.35)
Globulin: 3.3 g/dL (calc) (ref 1.9–3.7)
Glucose, Bld: 111 mg/dL (ref 65–139)
Potassium: 4.5 mmol/L (ref 3.5–5.3)
Total Bilirubin: 0.3 mg/dL (ref 0.2–1.2)
Total Protein: 7.6 g/dL (ref 6.1–8.1)

## 2020-01-10 LAB — VITAMIN D 25 HYDROXY (VIT D DEFICIENCY, FRACTURES): Vit D, 25-Hydroxy: 24 ng/mL — ABNORMAL LOW (ref 30–100)

## 2020-01-10 LAB — TEST AUTHORIZATION

## 2020-01-10 LAB — C-REACTIVE PROTEIN: CRP: 18.6 mg/L — ABNORMAL HIGH (ref <8.0)

## 2020-01-10 MED ORDER — FOLIC ACID 1 MG PO TABS: ORAL_TABLET | ORAL | 3 refills | Status: DC

## 2020-01-10 MED ORDER — VITAMIN D (ERGOCALCIFEROL) 1.25 MG (50000 UNIT) PO CAPS: 50000.0000 [IU] | ORAL_CAPSULE | ORAL | 1 refills | Status: AC

## 2020-01-12 ENCOUNTER — Other Ambulatory Visit: Payer: Self-pay

## 2020-01-12 DIAGNOSIS — R7989 Other specified abnormal findings of blood chemistry: Secondary | ICD-10-CM

## 2020-01-12 DIAGNOSIS — D691 Qualitative platelet defects: Secondary | ICD-10-CM

## 2020-01-16 ENCOUNTER — Other Ambulatory Visit: Payer: Self-pay

## 2020-02-19 ENCOUNTER — Other Ambulatory Visit: Payer: Self-pay

## 2020-02-19 DIAGNOSIS — D691 Qualitative platelet defects: Secondary | ICD-10-CM

## 2020-02-24 LAB — CBC WITH DIFFERENTIAL/PLATELET
Absolute Monocytes: 561 cells/uL (ref 200–950)
Basophils Absolute: 59 cells/uL (ref 0–200)
Basophils Relative: 1 %
Eosinophils Absolute: 71 cells/uL (ref 15–500)
Eosinophils Relative: 1.2 %
HCT: 42.9 % (ref 38.5–50.0)
Hemoglobin: 14.2 g/dL (ref 13.2–17.1)
Lymphs Abs: 1339 cells/uL (ref 850–3900)
MCH: 31.2 pg (ref 27.0–33.0)
MCHC: 33.1 g/dL (ref 32.0–36.0)
MCV: 94.3 fL (ref 80.0–100.0)
MPV: 11 fL (ref 7.5–12.5)
Monocytes Relative: 9.5 %
Neutro Abs: 3870 cells/uL (ref 1500–7800)
Neutrophils Relative %: 65.6 %
Platelets: 603 10*3/uL — ABNORMAL HIGH (ref 140–400)
RBC: 4.55 10*6/uL (ref 4.20–5.80)
RDW: 13.4 % (ref 11.0–15.0)
Total Lymphocyte: 22.7 %
WBC: 5.9 10*3/uL (ref 3.8–10.8)

## 2020-02-24 LAB — C-REACTIVE PROTEIN: CRP: 7.9 mg/L (ref <8.0)

## 2020-03-10 ENCOUNTER — Other Ambulatory Visit: Payer: Self-pay | Admitting: Gastroenterology

## 2020-03-13 NOTE — Telephone Encounter (Signed)
No. He should resume his daily vitamin D 3000 IU. He should have an upcoming appt with me.

## 2020-03-13 NOTE — Telephone Encounter (Signed)
Magda Paganini, does this patient need to continue Vitamin D 50,000IU weekly? Original Rx was for 8 weeks.

## 2020-04-15 ENCOUNTER — Other Ambulatory Visit: Payer: Self-pay

## 2020-04-15 ENCOUNTER — Ambulatory Visit: Payer: Medicaid Other | Admitting: Gastroenterology

## 2020-04-15 ENCOUNTER — Encounter: Payer: Self-pay | Admitting: Gastroenterology

## 2020-04-15 ENCOUNTER — Telehealth: Payer: Self-pay

## 2020-04-15 VITALS — BP 143/91 | HR 120 | Temp 97.5°F | Ht 66.0 in | Wt 154.8 lb

## 2020-04-15 DIAGNOSIS — D75839 Thrombocytosis, unspecified: Secondary | ICD-10-CM | POA: Insufficient documentation

## 2020-04-15 DIAGNOSIS — K5010 Crohn's disease of large intestine without complications: Secondary | ICD-10-CM

## 2020-04-15 MED ORDER — FOLIC ACID 1 MG PO TABS: ORAL_TABLET | ORAL | 3 refills | Status: DC

## 2020-04-15 MED ORDER — MERCAPTOPURINE 50 MG PO TABS: ORAL_TABLET | ORAL | 3 refills | Status: DC

## 2020-04-15 MED ORDER — BENZONATATE 100 MG PO CAPS: ORAL_CAPSULE | ORAL | 5 refills | Status: DC

## 2020-04-15 NOTE — Progress Notes (Signed)
Primary Care Physician: [name], MD  Primary Gastroenterologist:  Elon Alas. Abbey Chatters, DO   Chief Complaint  Patient presents with  . Crohn's Disease    Follow up    HPI: Tanner Bradley is a 30 y.o. male here for follow-up of Crohn's colitis.  Managed on mercaptopurine and folic acid.  Last seen back in August.  He also has a history of cerebral palsy, seizure disorder.  He has only had 1 colonoscopy and that was at the time of diagnosis of Crohn's colitis in 2008 by Dr. Carlis Abbott.  We discussed need for colon cancer screening at last office visit but he and mom not interested at the time.  In November patient had labs, CMet was normal, platelet count remains elevated at 568,000 which is stable over the past several years, vitamin D level was low at 24, CRP was elevated 18.6.  Clinically patient without signs of active Crohn's colitis. Mom reported that patient had a cold/congestion at time of his labs. We repeated labs in December and platelets were 603,000, CRP improved, upper limits of normal at 7.9.  Plans to update vitamin D, B12, CBC, CMet in May.  Patient feels good. Appetite good. Weight stable. No abdominal pain. BM regular. No diarrhea or blood in stool. No N/V, heartburn.   August 18, 2006: Hemoglobin 11.6, MCV 77.1, platelets 803,000 May 20, 2011: Hemoglobin 14.2, MCV 102.9, platelets 209,000 January 14, 2012: Hemoglobin 13.8, MCV 96.4, platelets 447,000 September 20, 2012: Hemoglobin 13.1, MCV 95.1, platelets 531,000 June 30, 2013: Hemoglobin 13.9, MCV 97.6, platelets 375,000 December 21, 2014: Hemoglobin 15.5, MCV 93.1, platelets 420,000 December 02, 2015: Hemoglobin 14.4, MCV 91.4, platelets 502,000 June 05, 2016: Hemoglobin 14.3, MCV 91.1, platelets 521,000 May 11, 2017: Hemoglobin 13.3, MCV 86.7, platelets 592,000 August 08, 2018: Hemoglobin 11.9, MCV 79.3, platelets 656,000 January 08, 2020: Hemoglobin 15.1, MCV 91.3, platelets 568,000 February 23, 2020: Hemoglobin  14.2, MCV 94.3, platelets 603,000    Current Outpatient Medications  Medication Sig Dispense Refill  . acetaminophen (TYLENOL) 500 MG tablet Take 500 mg by mouth as needed for pain.    . benzonatate (TESSALON PERLES) 100 MG capsule 1 PO TID FOR 7 DAYS WHEN WHEN NEEDED FOR COUGH 60 capsule 5  . budesonide-formoterol (SYMBICORT) 80-4.5 MCG/ACT inhaler Inhale 2 puffs into the lungs 2 (two) times daily. FOR 7 DAYS THEN 2 PUFFS DAILY FOR 7 DAYS 1 Inhaler 11  . calcium citrate (CALCITRATE - DOSED IN MG ELEMENTAL CALCIUM) 950 MG tablet Take 2 tablets by mouth daily.    . cholecalciferol (VITAMIN D3) 25 MCG (1000 UT) tablet Take 3 tablets (3,000 Units total) by mouth daily. 90 tablet 11  . folic acid (FOLVITE) 1 MG tablet TAKE 1 TABLET(1 MG) BY MOUTH DAILY 90 tablet 3  . levETIRAcetam (KEPPRA) 500 MG tablet Take 500 mg by mouth 2 (two) times daily.    . mercaptopurine (PURINETHOL) 50 MG tablet TAKE 1 TABLET EVERY DAY ON EMPTY STOMACH 90 tablet 3  . polyethylene glycol powder (GLYCOLAX/MIRALAX) powder Take 17 g by mouth daily. (Patient taking differently: Take 17 g by mouth as needed.) 527 g 3  . Probiotic CAPS 1 PO DAILY. 30 capsule 11  . Sodium Fluoride (PREVIDENT 5000 BOOSTER PLUS) 1.1 % PSTE Place onto teeth at bedtime.    Marland Kitchen Spacer/Aero-Holding Chambers (AEROCHAMBER MV) inhaler Use WITH INHALER (Patient taking differently: as needed. Use WITH INHALER) 1 each 0  . Wheat Dextrin (BENEFIBER) POWD 1 SCOOP PO  TID (Patient taking differently: as needed. 1 SCOOP PO TID) 730 g 11   No current facility-administered medications for this visit.    Allergies as of 04/15/2020 - Review Complete 04/15/2020  Allergen Reaction Noted  . Other Other (See Comments) 01/08/2013  . Amoxicillin Rash 10/16/2010    ROS:  General: Negative for anorexia, weight loss, fever, chills, fatigue, weakness. ENT: Negative for hoarseness, difficulty swallowing , nasal congestion. CV: Negative for chest pain, angina,  palpitations, dyspnea on exertion, peripheral edema.  Respiratory: Negative for dyspnea at rest, dyspnea on exertion, cough, sputum, wheezing.  GI: See history of present illness. GU:  Negative for dysuria, hematuria, urinary incontinence, urinary frequency, nocturnal urination.  Endo: Negative for unusual weight change.    Physical Examination:   BP (!) 143/91   Pulse (!) 120   Temp (!) 97.5 F (36.4 C) (Temporal)   Ht 5' 6"  (1.676 m)   Wt 154 lb 12.8 oz (70.2 kg)   BMI 24.99 kg/m   General: Well-nourished, well-developed in no acute distress.  Eyes: No icterus. Mouth: masked Lungs: Clear to auscultation bilaterally.  Heart: Regular rate and rhythm, no murmurs rubs or gallops.  Abdomen: Bowel sounds are normal, nontender, nondistended, no hepatosplenomegaly or masses, no abdominal bruits or hernia , no rebound or guarding.   Extremities: No lower extremity edema. No clubbing or deformities. Neuro: Alert and oriented x 4   Skin: Warm and dry, no jaundice.   Psych: Alert and cooperative, normal mood and affect.  Labs:  Lab Results  Component Value Date   CREATININE 0.84 01/08/2020   BUN 6 (L) 01/08/2020   NA 140 01/08/2020   K 4.5 01/08/2020   CL 103 01/08/2020   CO2 29 01/08/2020   Lab Results  Component Value Date   WBC 5.9 02/23/2020   HGB 14.2 02/23/2020   HCT 42.9 02/23/2020   MCV 94.3 02/23/2020   PLT 603 (H) 02/23/2020   Lab Results  Component Value Date   ALT 16 01/08/2020   AST 15 01/08/2020   ALKPHOS 85 08/01/2017   BILITOT 0.3 01/08/2020   Lab Results  Component Value Date   CRP 7.9 02/23/2020    Imaging Studies: No results found.  Assessment/plan:  30 year old male with history of seizure disorder, cerebral palsy, Crohn's colitis (diagnosed in 2008) presenting for follow-up.  Clinically doing well.  Denies any flares.  Takes mercaptopurine 50 mg daily, folic acid 1 mg daily.  He has had issues with vitamin D deficiency received high-dose  weekly vitamin D last fall, currently on maintenance dose of 3000 IU daily.  Patient also has a history of chronic thrombocytosis with some normal values at times as outlined above.  Question reactive thrombocytosis for familial.  We discussed possibility of seeing hematology for opinion but at this time patient prefers to hold off.  We also discussed updating colonoscopy for high risk screening given chronic Crohn's colitis as well as document remission.  Patient has high anxiety level regarding colonoscopy/sedation, got quite upset in the office today when we discussed.  We will plan to update labs in May to include CBC with smear review, ferritin, iron/TIBC, CRP, sed rate, B12, folate, CMet, vitamin D (25-hydroxy).  Patient will continue to monitor for any change in bowel habits, abdominal pain, diminished appetite, blood in the stool, weight loss.  We will plan to see him back in the office in August, consider colonoscopy after that visit if patient is agreeable. Refilled mercaptopurine, folic acid, and tessalon  pearls (prn use).

## 2020-04-15 NOTE — Telephone Encounter (Signed)
Pt's mother wanted to let Neil Crouch, PA know before leaving pts office visit today 04/15/20, that pt would be more comfortable going to the hematologist before being put to sleep for a TCS. Pt's mother also mentioned that she will send a mychart message as well.

## 2020-04-15 NOTE — Patient Instructions (Addendum)
1. Continue folic acid 1 mg daily, mercaptopurine 50 mg daily for Crohn's disease. 2. We will recheck your labs in May.  We will send you lab orders closer to the time when you are due. 3. Monitor for any concerns such as change in bowels, abdominal pain, decreased appetite, blood in the stool.  If you see any of these, please let me know. 4. We will see you back in August, consider colonoscopy after that visit if agreeable.

## 2020-04-15 NOTE — Telephone Encounter (Signed)
To address via mychart message.

## 2020-04-16 ENCOUNTER — Other Ambulatory Visit: Payer: Self-pay

## 2020-04-16 ENCOUNTER — Telehealth: Payer: Self-pay | Admitting: Gastroenterology

## 2020-04-16 DIAGNOSIS — E559 Vitamin D deficiency, unspecified: Secondary | ICD-10-CM

## 2020-04-16 DIAGNOSIS — D649 Anemia, unspecified: Secondary | ICD-10-CM

## 2020-04-16 DIAGNOSIS — K50119 Crohn's disease of large intestine with unspecified complications: Secondary | ICD-10-CM

## 2020-04-16 NOTE — Telephone Encounter (Signed)
Patient will need the following labs in 07/2020.  CBC with diff Pathologist smear review ferritin, iron/TIBC  CRP  sed rate  B12  folate  CMet  vitamin D (25-hydroxy)

## 2020-04-16 NOTE — Telephone Encounter (Signed)
noted 

## 2020-04-16 NOTE — Telephone Encounter (Signed)
Noted  

## 2020-04-17 ENCOUNTER — Telehealth: Payer: Self-pay | Admitting: Gastroenterology

## 2020-04-17 NOTE — Telephone Encounter (Signed)
Noted and mailed

## 2020-04-17 NOTE — Telephone Encounter (Signed)
Please send patient his lab orders that were entered yesterday. He will go sometime in March to have labs done.   We also need to NIC for a colonoscopy with Dr. Abbey Chatters. Procedure to be done in June 2022, so please NIC for May so he can be put on schedule. Per Dr. Abbey Chatters, patient can be triaged, does not need OV.

## 2020-05-08 ENCOUNTER — Telehealth: Payer: Self-pay

## 2020-05-08 NOTE — Telephone Encounter (Signed)
Refill request sent from [facility] Dr for Prevident 5000 BST Plus PasteFruit, apply with toothbrush at bedtime, #qty [phone]

## 2020-05-08 NOTE — Telephone Encounter (Signed)
Do not see that we have prescribed Prevident. He should be getting this from his dentist.

## 2020-05-09 NOTE — Telephone Encounter (Signed)
Recommend he talk with his dentist or PCP about getting refills. This is not something we usually prescribe.

## 2020-05-09 NOTE — Telephone Encounter (Signed)
Noted. Discussed with pts and she will talk with [facility] and have it removed.

## 2020-05-09 NOTE — Telephone Encounter (Signed)
The paper said that it was prescribed by [name].

## 2020-05-22 ENCOUNTER — Telehealth: Payer: Self-pay

## 2020-05-22 NOTE — Telephone Encounter (Signed)
That would be fine 

## 2020-05-22 NOTE — Telephone Encounter (Signed)
Called Tanner Bradley, pts mother because the pt is on the May recall list for a tcs in June. I was asking her if she thought Vivian would do ok with a nurse visit or would a telephone visit be better for him. Tanner Bradley stated she would like to wait to schedule anything for him right now. She is having an upcoming unplanned surgery for herself and they have a lot going on right now. She wants to do the blood work in May and just talk to [place] about the colonoscopy when they come back for his follow up visit in August. Advised her that I would let Magda Paganini know.

## 2020-05-23 ENCOUNTER — Other Ambulatory Visit: Payer: Self-pay

## 2020-05-23 NOTE — Telephone Encounter (Signed)
Per office note from 05/09/2020 we do not prescribe here and it was suppose to be sent to pt's PCP.

## 2020-07-09 ENCOUNTER — Telehealth: Payer: Self-pay

## 2020-07-09 LAB — IRON,TIBC AND FERRITIN PANEL
%SAT: 31 % (calc) (ref 20–48)
Ferritin: 25 ng/mL — ABNORMAL LOW (ref 38–380)
Iron: 79 ug/dL (ref 50–180)
TIBC: 252 mcg/dL (calc) (ref 250–425)

## 2020-07-09 LAB — COMPREHENSIVE METABOLIC PANEL
AG Ratio: 1.4 (calc) (ref 1.0–2.5)
ALT: 11 U/L (ref 9–46)
AST: 14 U/L (ref 10–40)
Albumin: 4.2 g/dL (ref 3.6–5.1)
Alkaline phosphatase (APISO): 65 U/L (ref 36–130)
BUN: 7 mg/dL (ref 7–25)
CO2: 31 mmol/L (ref 20–32)
Calcium: 9.4 mg/dL (ref 8.6–10.3)
Chloride: 102 mmol/L (ref 98–110)
Creat: 0.76 mg/dL (ref 0.60–1.35)
Globulin: 3 g/dL (calc) (ref 1.9–3.7)
Glucose, Bld: 89 mg/dL (ref 65–139)
Potassium: 4.8 mmol/L (ref 3.5–5.3)
Sodium: 141 mmol/L (ref 135–146)
Total Bilirubin: 0.5 mg/dL (ref 0.2–1.2)
Total Protein: 7.2 g/dL (ref 6.1–8.1)

## 2020-07-09 LAB — SEDIMENTATION RATE: Sed Rate: 11 mm/h (ref 0–15)

## 2020-07-09 LAB — CBC WITH DIFFERENTIAL/PLATELET
Absolute Monocytes: 522 cells/uL (ref 200–950)
Basophils Absolute: 60 cells/uL (ref 0–200)
Basophils Relative: 1 %
Eosinophils Absolute: 108 cells/uL (ref 15–500)
Eosinophils Relative: 1.8 %
HCT: 44.8 % (ref 38.5–50.0)
Hemoglobin: 14.4 g/dL (ref 13.2–17.1)
Lymphs Abs: 1434 cells/uL (ref 850–3900)
MCH: 31.2 pg (ref 27.0–33.0)
MCHC: 32.1 g/dL (ref 32.0–36.0)
MCV: 97.2 fL (ref 80.0–100.0)
MPV: 11 fL (ref 7.5–12.5)
Monocytes Relative: 8.7 %
Neutro Abs: 3876 cells/uL (ref 1500–7800)
Neutrophils Relative %: 64.6 %
Platelets: 535 10*3/uL — ABNORMAL HIGH (ref 140–400)
RBC: 4.61 10*6/uL (ref 4.20–5.80)
RDW: 15 % (ref 11.0–15.0)
Total Lymphocyte: 23.9 %
WBC: 6 10*3/uL (ref 3.8–10.8)

## 2020-07-09 LAB — C-REACTIVE PROTEIN: CRP: 6.7 mg/L (ref <8.0)

## 2020-07-09 LAB — B12 AND FOLATE PANEL
Folate: 20.3 ng/mL
Vitamin B-12: 350 pg/mL (ref 200–1100)

## 2020-07-09 LAB — VITAMIN D 25 HYDROXY (VIT D DEFICIENCY, FRACTURES): Vit D, 25-Hydroxy: 39 ng/mL (ref 30–100)

## 2020-07-09 LAB — PATHOLOGIST SMEAR REVIEW

## 2020-07-09 NOTE — Telephone Encounter (Signed)
Sending patient's mother Deloris Ping message as she has communicated twice via mychart since this telephone note made.

## 2020-07-09 NOTE — Telephone Encounter (Signed)
Hi Tanner Bradley,  The pt's mother phoned here this morning inquiring about the pt's lab results from yesterday. I advised the mother as of then we did not have anything once they hit our box we will call and that the Dr's are seeing pt's this morning. She stated that she could get into her box but need a new code for the pt. Once again she was advised that they will get a call once the results are released to [place] or go back on to [facility] request a new code.

## 2020-07-10 ENCOUNTER — Telehealth: Payer: Self-pay | Admitting: Gastroenterology

## 2020-07-10 NOTE — Telephone Encounter (Signed)
See other phone note

## 2020-07-10 NOTE — Telephone Encounter (Signed)
Patient mother called again asking to speak to Magda Paganini, stated she is having problems with Mychart and can not read the messages.  I asked her if she has contacted MyChart support and she said yes, I suggested she reach out to them again.

## 2020-07-10 NOTE — Telephone Encounter (Signed)
See result note.  

## 2020-09-06 ENCOUNTER — Telehealth: Payer: Self-pay

## 2020-09-06 NOTE — Telephone Encounter (Signed)
[facility]/[address] is requesting a drug change request. The pt's folic acid 1 mg tabs not covered by his insurance plan. The preferred alternative is [id number]. Medication along with the strength, directions, quantity and refills needs to be changed. I will hold on to this Drug Change Request until you are here if you like.

## 2020-09-11 NOTE — Telephone Encounter (Signed)
Do you have a fax from pharmacy? I'm not sure what is being asked.

## 2020-09-11 NOTE — Telephone Encounter (Signed)
Yes, I'm bringing it to you now.

## 2020-09-17 ENCOUNTER — Telehealth: Payer: Self-pay

## 2020-09-17 NOTE — Telephone Encounter (Signed)
error 

## 2020-09-17 NOTE — Telephone Encounter (Signed)
ALREADY FAXED

## 2020-09-17 NOTE — Telephone Encounter (Signed)
Please fax response back to pharmacy. Placed on your desk.

## 2020-10-10 ENCOUNTER — Encounter: Payer: Self-pay | Admitting: Internal Medicine

## 2020-10-10 ENCOUNTER — Ambulatory Visit: Payer: Medicaid Other | Admitting: Internal Medicine

## 2020-10-10 ENCOUNTER — Other Ambulatory Visit: Payer: Self-pay

## 2020-10-10 ENCOUNTER — Other Ambulatory Visit: Payer: Self-pay | Admitting: *Deleted

## 2020-10-10 VITALS — BP 144/96 | HR 108 | Temp 97.8°F | Ht 66.0 in | Wt 163.0 lb

## 2020-10-10 DIAGNOSIS — D75839 Thrombocytosis, unspecified: Secondary | ICD-10-CM

## 2020-10-10 DIAGNOSIS — E611 Iron deficiency: Secondary | ICD-10-CM

## 2020-10-10 DIAGNOSIS — E559 Vitamin D deficiency, unspecified: Secondary | ICD-10-CM

## 2020-10-10 DIAGNOSIS — K5010 Crohn's disease of large intestine without complications: Secondary | ICD-10-CM

## 2020-10-10 MED ORDER — MERCAPTOPURINE 50 MG PO TABS: ORAL_TABLET | ORAL | 3 refills | Status: DC

## 2020-10-10 MED ORDER — BENZONATATE 100 MG PO CAPS: ORAL_CAPSULE | ORAL | 5 refills | Status: DC

## 2020-10-10 MED ORDER — FOLIC ACID 1 MG PO TABS: ORAL_TABLET | ORAL | 3 refills | Status: DC

## 2020-10-10 NOTE — Patient Instructions (Signed)
I am happy to hear you are doing well.  We will plan on blood work in November.  Continue on iron therapy for now.  We will recheck this in November.  Continue your 6-MP, folic acid, Tessalon Perles.  I will send in refills today.  We will plan on colonoscopy June 2023.  Otherwise follow-up in 6 months.  It was nice meeting both of you today.  Please call with any questions or concerns.  Dr. Abbey Chatters   At [facility] we value your feedback. You may receive a survey about your visit today. Please share your experience as we strive to create trusting relationships with our patients to provide genuine, compassionate, quality care.  We appreciate your understanding and patience as we review any laboratory studies, imaging, and other diagnostic tests that are ordered as we care for you. Our office policy is 5 business days for review of these results, and any emergent or urgent results are addressed in a timely manner for your best interest. If you do not hear from our office in 1 week, please contact us.   We also encourage the use of MyChart, which contains your medical information for your review as well. If you are not enrolled in this feature, an access code is on this after visit summary for your convenience. Thank you for allowing [place] to be involved in your care.  It was great to see you today!  I hope you have a great rest of your summer!!    Elon Alas. Abbey Chatters, D.O. Gastroenterology and Hepatology [facility]

## 2020-10-10 NOTE — Progress Notes (Signed)
Referring Provider: [name], MD Primary Care Physician:  [name], MD Primary GI:  Dr. Abbey Chatters  Chief Complaint  Patient presents with   Crohn's Disease    F/u. Doing fine    HPI:   Tanner Bradley is a 30 y.o. male who presents to the clinic today for follow-up visit.  Has a history of Crohn's colitis diagnosed in 2018.  Chronically managed on 6-MP and folic acid.  States he is doing well.  No melena hematochezia.  No abdominal pain.  No diarrhea.  No mucus in his stool.  Has actually gained 10 pounds since his visit in February.  Only colonoscopy at time of diagnosis by Dr. Carlis Abbott.    Recently started iron therapy in May after labs showed slightly low ferritin.  Tolerating this without any issues.  Has chronic thrombocytosis.  Has wished to avoid hematology referral in the past.  Most recent platelet count 535K.  Past Medical History:  Diagnosis Date   C. difficile colitis NOV 2013   Cerebral palsy ([place])    Crohn's disease ([place]) 2008 TCS POS SBFT NL   Rx: Prednisone then 6-MP   Osteopenia    Seizures ([place]) 1992   Vitamin D deficiency     Past Surgical History:  Procedure Laterality Date   COLONOSCOPY  2008   COLITIS, bX: POS GRANULOMA-->CD    Current Outpatient Medications  Medication Sig Dispense Refill   acetaminophen (TYLENOL) 500 MG tablet Take 500 mg by mouth as needed for pain.     benzonatate (TESSALON PERLES) 100 MG capsule 1 PO TID FOR 7 DAYS WHEN WHEN NEEDED FOR COUGH 60 capsule 5   budesonide-formoterol (SYMBICORT) 80-4.5 MCG/ACT inhaler Inhale 2 puffs into the lungs 2 (two) times daily. FOR 7 DAYS THEN 2 PUFFS DAILY FOR 7 DAYS (Patient taking differently: Inhale 2 puffs into the lungs 2 (two) times daily. As needed) 1 Inhaler 11   calcium citrate (CALCITRATE - DOSED IN MG ELEMENTAL CALCIUM) 950 MG tablet Take 2 tablets by mouth daily.     cholecalciferol (VITAMIN D3) 25 MCG (1000 UT) tablet Take 3 tablets (3,000 Units total) by mouth daily. 90  tablet 11   ferrous sulfate 325 (65 FE) MG tablet 2 tablets daily     folic acid (FOLVITE) 1 MG tablet TAKE 1 TABLET(1 MG) BY MOUTH DAILY 90 tablet 3   levETIRAcetam (KEPPRA) 500 MG tablet Take 500 mg by mouth 2 (two) times daily.     mercaptopurine (PURINETHOL) 50 MG tablet TAKE 1 TABLET EVERY DAY ON EMPTY STOMACH 90 tablet 3   polyethylene glycol powder (GLYCOLAX/MIRALAX) powder Take 17 g by mouth daily. (Patient taking differently: Take 17 g by mouth as needed.) 527 g 3   Probiotic CAPS 1 PO DAILY. 30 capsule 11   Sodium Fluoride (PREVIDENT 5000 BOOSTER PLUS) 1.1 % PSTE Place onto teeth at bedtime.     Spacer/Aero-Holding Chambers (AEROCHAMBER MV) inhaler Use WITH INHALER (Patient taking differently: as needed. Use WITH INHALER) 1 each 0   Vitamin D, Ergocalciferol, (DRISDOL) 1.25 MG (50000 UNIT) CAPS capsule Take 50,000 Units by mouth every 7 (seven) days. As needed     Wheat Dextrin (BENEFIBER) POWD 1 SCOOP PO TID (Patient taking differently: as needed. 1 SCOOP PO TID) 730 g 11   No current facility-administered medications for this visit.    Allergies as of 10/10/2020 - Review Complete 10/10/2020  Allergen Reaction Noted   Other Other (See Comments) 01/08/2013   Amoxicillin Rash  10/16/2010    Family History  Problem Relation Age of Onset   Colon cancer Neg Hx    Colon polyps Neg Hx    Crohn's disease Neg Hx     Social History   Socioeconomic History   Marital status: Single    Spouse name: Not on file   Number of children: Not on file   Years of education: Not on file   Highest education level: Not on file  Occupational History   Not on file  Tobacco Use   Smoking status: Never   Smokeless tobacco: Never  Substance and Sexual Activity   Alcohol use: No   Drug use: No   Sexual activity: Not on file  Other Topics Concern   Not on file  Social History Narrative   [place]. HAS ONE SISTER.   MOM NOW DRIVES.   Social Determinants of Health   Financial  Resource Strain: Not on file  Food Insecurity: Not on file  Transportation Needs: Not on file  Physical Activity: Not on file  Stress: Not on file  Social Connections: Not on file    Subjective: Review of Systems  Constitutional:  Negative for chills and fever.  HENT:  Negative for congestion and hearing loss.   Eyes:  Negative for blurred vision and double vision.  Respiratory:  Negative for cough and shortness of breath.   Cardiovascular:  Negative for chest pain and palpitations.  Gastrointestinal:  Negative for abdominal pain, blood in stool, constipation, diarrhea, heartburn, melena and vomiting.  Genitourinary:  Negative for dysuria and urgency.  Musculoskeletal:  Negative for joint pain and myalgias.  Skin:  Negative for itching and rash.  Neurological:  Negative for dizziness and headaches.  Psychiatric/Behavioral:  Negative for depression. The patient is not nervous/anxious.     Objective: BP (!) 144/96   Pulse (!) 108   Temp 97.8 F (36.6 C)   Ht _0  (1.676 m)   Wt 163 lb (73.9 kg)   BMI 26.31 kg/m  Physical Exam Constitutional:      Appearance: Normal appearance.  HENT:     Head: Normocephalic and atraumatic.  Eyes:     Extraocular Movements: Extraocular movements intact.     Conjunctiva/sclera: Conjunctivae normal.  Cardiovascular:     Rate and Rhythm: Normal rate and regular rhythm.  Pulmonary:     Effort: Pulmonary effort is normal.     Breath sounds: Normal breath sounds.  Abdominal:     General: Bowel sounds are normal.     Palpations: Abdomen is soft.  Musculoskeletal:        General: Normal range of motion.     Cervical back: Normal range of motion and neck supple.  Skin:    General: Skin is warm.  Neurological:     General: No focal deficit present.     Mental Status: He is alert and oriented to person, place, and time.  Psychiatric:        Mood and Affect: Mood normal.        Behavior: Behavior normal.     Assessment: *Crohn's  colitis-diagnosed 2008, clinically well controlled *Vitamin D deficiency *Thrombocytosis-chronic *Iron deficiency  Plan: Clinically, patient doing well from a Crohn's colitis standpoint.  No abdominal pain, diarrhea, melena, hematochezia today.  Has not had a colonoscopy since 2008.  I did recommend colonoscopy today both for evaluation of disease activity as well as for colon cancer screening.  Patient and his mom would like to hold off  for now.  They state they would be willing to have this done in June 2023 on his anniversary of diagnosis.    Continue on 6-MP.  Most recent blood counts stable.  Continue on iron therapy.  Continue on folic acid.  We will plan on repeat blood work in November including CBC, CMP, vitamin D, iron studies, folate, CRP, ESR.  Platelet count stable, patient does not want to see a hematologist at this time.  Follow-up with GI in 6 months or sooner if needed.  10/10/2020 11:00 AM   Disclaimer: This note was dictated with voice recognition software. Similar sounding words can inadvertently be transcribed and may not be corrected upon review.

## 2020-10-15 ENCOUNTER — Ambulatory Visit: Payer: Medicaid Other | Admitting: Gastroenterology

## 2020-11-05 ENCOUNTER — Telehealth: Payer: Self-pay | Admitting: Internal Medicine

## 2020-11-05 NOTE — Telephone Encounter (Signed)
Pt's mother, Aletha Halim, was returning Dr Ave Filter call regarding paperwork/letter that she needs for patient. Please call her at [phone]

## 2020-11-05 NOTE — Telephone Encounter (Signed)
Please print the following letter on [place] letterhead. Patient's mother Olegario Shearer will pick up from office. Thank you.    To whom it may concern,   Tanner Bradley (DOB 10-Dec-1990) is a patient of mine.  He has significant past medical history including Crohn's disease, difficulty swallowing resulting in frequent choking and regurgitation of food, as well as cerebral palsy and associated seizure disorder.    It is of my opinion that [name] needs someone to be with him on a daily basis to help care for him including his personal care, medical needs, someone to take him to and from medical appointments, help with activities of daily living including making meals, laundry, watching him eat his meals given his history of choking, daily monitoring for seizures, daily monitoring for evidence of flareup from his Crohn's disease.  If you have any further concerns, please contact our office and I would be happy to help.  Sincerely, Hurshel Keys D.O. [facility]

## 2020-11-05 NOTE — Telephone Encounter (Signed)
Noted  done

## 2020-11-05 NOTE — Telephone Encounter (Signed)
Noted   Dr. Abbey Chatters is working on the paperwork now

## 2020-12-09 ENCOUNTER — Other Ambulatory Visit: Payer: Self-pay | Admitting: *Deleted

## 2020-12-09 ENCOUNTER — Encounter: Payer: Self-pay | Admitting: *Deleted

## 2020-12-09 DIAGNOSIS — D75839 Thrombocytosis, unspecified: Secondary | ICD-10-CM

## 2020-12-09 DIAGNOSIS — K5010 Crohn's disease of large intestine without complications: Secondary | ICD-10-CM

## 2020-12-09 DIAGNOSIS — E611 Iron deficiency: Secondary | ICD-10-CM

## 2020-12-09 DIAGNOSIS — E559 Vitamin D deficiency, unspecified: Secondary | ICD-10-CM

## 2020-12-17 ENCOUNTER — Telehealth: Payer: Self-pay

## 2020-12-17 LAB — COMPREHENSIVE METABOLIC PANEL
AG Ratio: 1.6 (calc) (ref 1.0–2.5)
ALT: 14 U/L (ref 9–46)
AST: 15 U/L (ref 10–40)
Albumin: 4.3 g/dL (ref 3.6–5.1)
Alkaline phosphatase (APISO): 57 U/L (ref 36–130)
BUN: 9 mg/dL (ref 7–25)
CO2: 32 mmol/L (ref 20–32)
Calcium: 9.2 mg/dL (ref 8.6–10.3)
Chloride: 102 mmol/L (ref 98–110)
Creat: 0.76 mg/dL (ref 0.60–1.26)
Globulin: 2.7 g/dL (calc) (ref 1.9–3.7)
Glucose, Bld: 78 mg/dL (ref 65–139)
Potassium: 4.4 mmol/L (ref 3.5–5.3)
Sodium: 140 mmol/L (ref 135–146)
Total Bilirubin: 0.6 mg/dL (ref 0.2–1.2)
Total Protein: 7 g/dL (ref 6.1–8.1)

## 2020-12-17 LAB — CBC WITH DIFFERENTIAL/PLATELET
Absolute Monocytes: 429 cells/uL (ref 200–950)
Basophils Absolute: 50 cells/uL (ref 0–200)
Basophils Relative: 0.9 %
Eosinophils Absolute: 110 cells/uL (ref 15–500)
Eosinophils Relative: 2 %
HCT: 44.8 % (ref 38.5–50.0)
Hemoglobin: 14.8 g/dL (ref 13.2–17.1)
Lymphs Abs: 1403 cells/uL (ref 850–3900)
MCH: 32.5 pg (ref 27.0–33.0)
MCHC: 33 g/dL (ref 32.0–36.0)
MCV: 98.2 fL (ref 80.0–100.0)
MPV: 10.9 fL (ref 7.5–12.5)
Monocytes Relative: 7.8 %
Neutro Abs: 3509 cells/uL (ref 1500–7800)
Neutrophils Relative %: 63.8 %
Platelets: 496 10*3/uL — ABNORMAL HIGH (ref 140–400)
RBC: 4.56 10*6/uL (ref 4.20–5.80)
RDW: 14.6 % (ref 11.0–15.0)
Total Lymphocyte: 25.5 %
WBC: 5.5 10*3/uL (ref 3.8–10.8)

## 2020-12-17 LAB — IRON,TIBC AND FERRITIN PANEL
%SAT: 42 % (calc) (ref 20–48)
Ferritin: 53 ng/mL (ref 38–380)
Iron: 98 ug/dL (ref 50–180)
TIBC: 233 mcg/dL (calc) — ABNORMAL LOW (ref 250–425)

## 2020-12-17 LAB — B12 AND FOLATE PANEL
Folate: 12.5 ng/mL
Vitamin B-12: 345 pg/mL (ref 200–1100)

## 2020-12-17 LAB — SEDIMENTATION RATE: Sed Rate: 9 mm/h (ref 0–15)

## 2020-12-17 LAB — C-REACTIVE PROTEIN: CRP: 4.9 mg/L (ref <8.0)

## 2020-12-17 NOTE — Telephone Encounter (Signed)
The pt's mother phoned inquiring of the pt's lab results because she cannot get into the MyChart. He had them done yesterday.

## 2020-12-18 NOTE — Telephone Encounter (Signed)
I looked up the results Dr Abbey Chatters called and spoke to them, but didn't send to me. Thank you please disregard

## 2020-12-18 NOTE — Telephone Encounter (Signed)
Did Dr. Abbey Bradley order them? He saw her last. I also don't see any lab results in my inbasket.

## 2020-12-18 NOTE — Telephone Encounter (Signed)
OK. Thanks.

## 2021-04-09 ENCOUNTER — Encounter: Payer: Self-pay | Admitting: Gastroenterology

## 2021-04-09 ENCOUNTER — Other Ambulatory Visit: Payer: Self-pay

## 2021-04-09 ENCOUNTER — Ambulatory Visit: Payer: Medicaid Other | Admitting: Gastroenterology

## 2021-04-09 VITALS — BP 124/86 | HR 134 | Temp 97.5°F | Ht 66.0 in | Wt 155.8 lb

## 2021-04-09 DIAGNOSIS — E559 Vitamin D deficiency, unspecified: Secondary | ICD-10-CM

## 2021-04-09 DIAGNOSIS — K5010 Crohn's disease of large intestine without complications: Secondary | ICD-10-CM | POA: Diagnosis not present

## 2021-04-09 DIAGNOSIS — E611 Iron deficiency: Secondary | ICD-10-CM

## 2021-04-09 MED ORDER — FOLIC ACID 1 MG PO TABS: ORAL_TABLET | ORAL | 3 refills | Status: DC

## 2021-04-09 MED ORDER — MERCAPTOPURINE 50 MG PO TABS: ORAL_TABLET | ORAL | 3 refills | Status: DC

## 2021-04-09 MED ORDER — BENZONATATE 100 MG PO CAPS: ORAL_CAPSULE | ORAL | 5 refills | Status: DC

## 2021-04-09 NOTE — Patient Instructions (Signed)
Continue your medications as before, I will work on refills later today.  We will plan to see you back in 10/2021. We can discuss updating labs at that time.  You are due colonoscopy whenever you are ready. Please send me a message when you decide to move forward, but allow at least four weeks for scheduling if you are looking at a certain Month.

## 2021-04-09 NOTE — Progress Notes (Signed)
Primary Care Physician: [name], MD  Primary Gastroenterologist:  Elon Alas. Abbey Chatters, DO   Chief Complaint  Patient presents with   Crohn's Disease    F/u. Doing well. No complaints    HPI: Tanner Bradley is a 31 y.o. male here for follow-up.  Last seen in August.  He has a history of Crohn's colitis diagnosed in 2008, he is chronically managed on 6-MP, folic acid.  Only colonoscopy was done at time of diagnosis by Dr. Carlis Abbott.  He is on iron therapy for slightly low ferritin, started back in 07/2020.  H/O chronic thrombocytosis, which has improved with correction of iron deficiencies. They have declined hematology consult in the past as patient does not do well with new providers/new situations.  History of vitamin D deficiency as well, has been corrected and maintained on daily supplement. He has been advised to complete a colonoscopy to evaluate for disease activity as well as for colon cancer screening but patient/mother have declined several times.   Labs completed October 2022: CRP 4.9, white blood cell count 5500, hemoglobin 14.8, platelets 496,000, iron 98, TIBC 233, iron saturations 42%, ferritin 53, glucose 78, creatinine 0.76, sodium 140, potassium 4.4, albumin 4.3, sed rate 9, vitamin B12 345, folate 12.5. Last vitamin D check in May 2022, was 35, up from 31 January 2020.  Patient doing well from GI standpoint.  Bowel movements are regular.  No blood in the stool or melena.  No abdominal pain.  Since his sister was diagnosed with diabetes, he has tried to cut back on his sweets as well.  His weight is stable.  Denies any nausea or vomiting.  No heartburn.  Appetite remains good.   Current Outpatient Medications  Medication Sig Dispense Refill   acetaminophen (TYLENOL) 500 MG tablet Take 500 mg by mouth as needed for pain.     benzonatate (TESSALON PERLES) 100 MG capsule 1 PO TID FOR 7 DAYS WHEN WHEN NEEDED FOR  COUGH 60 capsule 5   budesonide-formoterol (SYMBICORT) 80-4.5 MCG/ACT inhaler Inhale 2 puffs into the lungs 2 (two) times daily. FOR 7 DAYS THEN 2 PUFFS DAILY FOR 7 DAYS (Patient taking differently: Inhale 2 puffs into the lungs 2 (two) times daily. As needed) 1 Inhaler 11   calcium citrate (CALCITRATE - DOSED IN MG ELEMENTAL CALCIUM) 950 MG tablet Take 2 tablets by mouth daily.     cholecalciferol (VITAMIN D3) 25 MCG (1000 UT) tablet Take 3 tablets (3,000 Units total) by mouth daily. 90 tablet 11   ferrous sulfate 325 (65 FE) MG tablet 2 tablets daily     folic acid (FOLVITE) 1 MG tablet TAKE 1 TABLET(1 MG) BY MOUTH DAILY 90 tablet 3   levETIRAcetam (KEPPRA) 500 MG tablet Take 500 mg by mouth 2 (two) times daily.     mercaptopurine (PURINETHOL) 50 MG tablet TAKE 1 TABLET EVERY DAY ON EMPTY STOMACH 90 tablet 3   polyethylene glycol powder (GLYCOLAX/MIRALAX) powder Take 17 g by mouth daily. (Patient taking differently: Take 17 g by mouth as needed.) 527 g 3   Probiotic CAPS 1 PO DAILY. 30 capsule 11   Sodium Fluoride (PREVIDENT 5000 BOOSTER PLUS) 1.1 % PSTE Place onto teeth at bedtime.     Spacer/Aero-Holding Chambers (AEROCHAMBER MV) inhaler Use WITH INHALER (Patient taking differently: as needed. Use WITH INHALER) 1 each 0   Vitamin D, Ergocalciferol, (DRISDOL) 1.25 MG (50000 UNIT) CAPS capsule Take 50,000 Units by mouth every 7 (seven) days. As needed  Wheat Dextrin (BENEFIBER) POWD 1 SCOOP PO TID (Patient taking differently: as needed. 1 SCOOP PO TID) 730 g 11   No current facility-administered medications for this visit.    Allergies as of 04/09/2021 - Review Complete 04/09/2021  Allergen Reaction Noted   Other Other (See Comments) 01/08/2013   Amoxicillin Rash 10/16/2010    ROS:  General: Negative for anorexia, weight loss, fever, chills, fatigue, weakness. ENT: Negative for hoarseness, difficulty swallowing , nasal congestion. CV: Negative for chest pain, angina, palpitations,  dyspnea on exertion, peripheral edema.  Respiratory: Negative for dyspnea at rest, dyspnea on exertion, cough, sputum, wheezing.  GI: See history of present illness. GU:  Negative for dysuria, hematuria, urinary incontinence, urinary frequency, nocturnal urination.  Endo: Negative for unusual weight change.    Physical Examination:   BP 124/86    Pulse (!) 134    Temp (!) 97.5 F (36.4 C)    Ht 5' 6"  (1.676 m)    Wt 155 lb 12.8 oz (70.7 kg)    BMI 25.15 kg/m   General: Well-nourished, well-developed in no acute distress.  Eyes: No icterus. Mouth: masked. Lungs: Clear to auscultation bilaterally.  Heart: Regular rate and rhythm, no murmurs rubs or gallops.  Abdomen: Bowel sounds are normal, nontender, nondistended, no hepatosplenomegaly or masses, no abdominal bruits or hernia , no rebound or guarding.   Extremities: No lower extremity edema. No clubbing or deformities. Neuro: Alert and oriented x 4   Skin: Warm and dry, no jaundice.   Psych: Alert and cooperative, normal mood and affect.  Labs:  Lab Results  Component Value Date   CREATININE 0.76 12/16/2020   BUN 9 12/16/2020   NA 140 12/16/2020   K 4.4 12/16/2020   CL 102 12/16/2020   CO2 32 12/16/2020   Lab Results  Component Value Date   KJZPHXTA56 979 12/16/2020   Lab Results  Component Value Date   FOLATE 12.5 12/16/2020   Lab Results  Component Value Date   WBC 5.5 12/16/2020   HGB 14.8 12/16/2020   HCT 44.8 12/16/2020   MCV 98.2 12/16/2020   PLT 496 (H) 12/16/2020   Lab Results  Component Value Date   ALT 14 12/16/2020   AST 15 12/16/2020   ALKPHOS 85 08/01/2017   BILITOT 0.6 12/16/2020   Lab Results  Component Value Date   CRP 4.9 12/16/2020    Imaging Studies: No results found.   Assessment:  Crohn's colitis: Clinically doing well.  No flares.  Compliant with medications.  He is overdue for colonoscopy for both reassessing disease activity and for high risk screening.  Mother continues to  decline at this time.  Will consider to have over the summer.  Advised to contact us at least a month before she would like to pursue so that we can get scheduled.  Would consider triaging patient.  Plan for follow-up in the office in August.  IDA: Ferritin improved with oral iron supplement.  Notably his thrombocytosis has also improved significantly.  Hold off on hematology evaluation.  Vitamin D deficiency: Corrected, continue daily dosing as before.  Osteopenia: Last bone density done in 2019.  He continues on calcium and vitamin D.  Would consider updating after next office visit.  Patient/mother agreeable.  Plan: Continue 6-MP 50 mg daily.  Refilled today. Continue folic acid 1 mg daily.  Refill today.   Continue iron 325 mg twice daily. Continue vitamin D 3000 units daily. Take MiraLAX 17 g daily as needed.  Take Benefiber 3 times daily as needed. Continue calcium 2 tablets daily. Continue to encourage patient to consider colonoscopy.  Reiterated today that even though he feels like he is doing well clinically, colonoscopy is also advised because he but increased risk of developing colon cancer due to his chronic IBD.  I will consider, possibly called to have done over the summer prior to coming back for office visit in August. Mother has requested on holding off on labs until he comes back in August 2023. He requested refills on Tessalon Perles to have on hand when he has cough.

## 2021-04-15 ENCOUNTER — Encounter: Payer: Self-pay | Admitting: Gastroenterology

## 2021-04-24 ENCOUNTER — Telehealth: Payer: Self-pay | Admitting: Internal Medicine

## 2021-04-24 ENCOUNTER — Telehealth: Payer: Self-pay

## 2021-04-24 NOTE — Telephone Encounter (Signed)
Pt's mother LMOVM regarding the pt's teeth. Think it may be a medication he is on. Pt has went from one cavity to 5. Please call regarding side effects of medication.

## 2021-04-24 NOTE — Telephone Encounter (Signed)
Pt seen Tanner Bradley last and a message has been sent to Tammy's box

## 2021-04-24 NOTE — Telephone Encounter (Signed)
Pt's mother called and wanted to get a message to Dr Abbey Chatters. I transferred call to nurse's VM.

## 2021-04-25 NOTE — Telephone Encounter (Signed)
Pt's mother called stating that the iron that the pt has been taking twice daily since last May has caused cavities in the pt's teeth. Pt has developed 5 cavities since starting the iron. Pt's dentist believes that it is eating away at his teeth like acid due to the pt having to crush it to take it and it isn't being washed away or dissolved good enough with the fluids that he drinks after crushing it. Mom is wanting to know if the pt can stop taking the iron since it was supposed to be temporary.

## 2021-04-25 NOTE — Telephone Encounter (Signed)
Can hold iron for now.   Please have him increase iron rich foods. Increase any of the following foods that he likes and tolerates.   Beef Lamb Ham [place] Chicken Veal Pork Dried beef Liver Liverwurst Eggs (any style) Shrimp Clams Scallops Oysters Tuna Sardines [place]addock Mackerel Spinach Sweet potatoes Peas Broccoli String beans Beet greens Dandelion greens Collards Kale Chard White bread (enriched) Whole wheat bread Enriched pasta Wheat products Bran cereals Corn meal Oat cereal Cream of Wheat Rye bread Enriched rice Strawberries Watermelon Raisins Dates Figs Prunes Prune juice Dried apricots Dried peaches Tofu Beans (kidney, garbanzo, or white, canned) Tomato products (e.g., paste) Dried peas Dried beans Lentils Instant breakfast Corn syrup Maple syrup Molasses

## 2021-04-25 NOTE — Telephone Encounter (Signed)
Pt was made aware and verbalized understanding.

## 2021-04-25 NOTE — Telephone Encounter (Signed)
Spoke with mother and it is about the same thing that was addressed in the telephone note.

## 2021-06-24 ENCOUNTER — Encounter: Payer: Self-pay | Admitting: *Deleted

## 2021-06-24 ENCOUNTER — Encounter (INDEPENDENT_AMBULATORY_CARE_PROVIDER_SITE_OTHER): Payer: Self-pay | Admitting: *Deleted

## 2021-10-08 ENCOUNTER — Ambulatory Visit (INDEPENDENT_AMBULATORY_CARE_PROVIDER_SITE_OTHER): Payer: Medicaid Other | Admitting: Gastroenterology

## 2021-10-08 ENCOUNTER — Encounter: Payer: Self-pay | Admitting: Gastroenterology

## 2021-10-08 VITALS — BP 130/93 | HR 116 | Temp 97.7°F | Ht 66.0 in | Wt 139.8 lb

## 2021-10-08 DIAGNOSIS — E611 Iron deficiency: Secondary | ICD-10-CM

## 2021-10-08 DIAGNOSIS — K5010 Crohn's disease of large intestine without complications: Secondary | ICD-10-CM | POA: Diagnosis not present

## 2021-10-08 DIAGNOSIS — E559 Vitamin D deficiency, unspecified: Secondary | ICD-10-CM

## 2021-10-08 MED ORDER — FOLIC ACID 1 MG PO TABS: ORAL_TABLET | ORAL | 3 refills | Status: DC

## 2021-10-08 MED ORDER — MERCAPTOPURINE 50 MG PO TABS: ORAL_TABLET | ORAL | 3 refills | Status: DC

## 2021-10-08 NOTE — Progress Notes (Signed)
GI Office Note    Referring Provider: [name], MD Primary Care Physician:  [name], MD  Primary Gastroenterologist: Elon Alas. Abbey Chatters, DO   Chief Complaint   Chief Complaint  Patient presents with   Follow-up    Follow up on Crohn's, doing well, no issues.     History of Present Illness   Tanner Bradley is a 31 y.o. male presenting today for follow-up.  Last seen in the office back in February.  He has a history of Crohn's colitis diagnosed in 2008, chronically managed on 6-MP, folic acid.  Only colonoscopy was done at time of diagnosis by Dr. Carlis Abbott.  He has a history of both iron and vitamin D deficiency which has been corrected with oral supplementation.  We have been trying to encourage patient to complete a colonoscopy to evaluate for disease activity, restaging, colon cancer screening but patient and mother have declined several times.  History of osteopenia with last bone density study in 2019. Mother reports seeing endocrinologist who felt bone density study was inaccurate and recommended repeat in 2024. Patient came off oral iron due to numerous cavities and reports his dentist felt like his cavities were related to the oral iron. Patient has to cut iron in order to take.   Labs completed October 2022: CRP 4.9, white blood cell count 5500, hemoglobin 14.8, platelets 496,000, iron 98, TIBC 233, iron saturations 42%, ferritin 53, glucose 78, creatinine 0.76, sodium 140, potassium 4.4, albumin 4.3, sed rate 9, vitamin B12 345, folate 12.5. Last vitamin D check in May 2022, was 72, up from 31 January 2020.  Wt Readings from Last 3 Encounters:  10/08/21 139 lb 12.8 oz (63.4 kg)  04/09/21 155 lb 12.8 oz (70.7 kg)  10/10/20 163 lb (73.9 kg)   Today: patient has weaned off seizure medication 2/6 thru 3/7. Some weight loss then. Also eating more healthy diet since his sister was diagnosed with type 1 DM. Per mother, his weight has stabilized over past couple of  months. He is trying to eat plenty of iron rich food, particularly increased red meat to couple of times per week. Does not want to take iron tablets, used to cut them up to swallow and dentist feels this caused his cavities. Off iron since 04/2021 and sees dentist soon. He is doing well from GI standpoint. BM daily. No melena, brbpr. No abdominal pain. No N/V, heartburn.   He and his mother decline colonoscopy right now. He gets very stressed about procedures and they are afraid that this may cause him to have a seizure now that he is off meds. He is willing to have labs.    Medications   Current Outpatient Medications  Medication Sig Dispense Refill   acetaminophen (TYLENOL) 500 MG tablet Take 500 mg by mouth as needed for pain.     benzonatate (TESSALON PERLES) 100 MG capsule 1 PO TID FOR 7 DAYS WHEN WHEN NEEDED FOR COUGH 60 capsule 5   budesonide-formoterol (SYMBICORT) 80-4.5 MCG/ACT inhaler Inhale 2 puffs into the lungs 2 (two) times daily. FOR 7 DAYS THEN 2 PUFFS DAILY FOR 7 DAYS (Patient taking differently: Inhale 2 puffs into the lungs 2 (two) times daily. As needed) 1 Inhaler 11   calcium citrate (CALCITRATE - DOSED IN MG ELEMENTAL CALCIUM) 950 MG tablet Take 2 tablets by mouth daily.     cholecalciferol (VITAMIN D3) 25 MCG (1000 UT) tablet Take 3 tablets (3,000 Units total) by mouth daily. 90 tablet  11   folic acid (FOLVITE) 1 MG tablet TAKE 1 TABLET(1 MG) BY MOUTH DAILY 90 tablet 3   mercaptopurine (PURINETHOL) 50 MG tablet TAKE 1 TABLET EVERY DAY ON EMPTY STOMACH 90 tablet 3   polyethylene glycol powder (GLYCOLAX/MIRALAX) powder Take 17 g by mouth daily. (Patient taking differently: Take 17 g by mouth as needed.) 527 g 3   Probiotic CAPS 1 PO DAILY. 30 capsule 11   Sodium Fluoride (PREVIDENT 5000 BOOSTER PLUS) 1.1 % PSTE Place onto teeth at bedtime.     Spacer/Aero-Holding Chambers (AEROCHAMBER MV) inhaler Use WITH INHALER (Patient taking differently: as needed. Use WITH INHALER) 1 each  0   Wheat Dextrin (BENEFIBER) POWD 1 SCOOP PO TID (Patient taking differently: as needed. 1 SCOOP PO TID) 730 g 11   No current facility-administered medications for this visit.    Allergies   Allergies as of 10/08/2021 - Review Complete 10/08/2021  Allergen Reaction Noted   Other Other (See Comments) 01/08/2013   Amoxicillin Rash 10/16/2010      Review of Systems   General: Negative for anorexia, weight loss, fever, chills, fatigue, weakness. ENT: Negative for hoarseness, difficulty swallowing , nasal congestion. CV: Negative for chest pain, angina, palpitations, dyspnea on exertion, peripheral edema.  Respiratory: Negative for dyspnea at rest, dyspnea on exertion, cough, sputum, wheezing.  GI: See history of present illness. GU:  Negative for dysuria, hematuria, urinary incontinence, urinary frequency, nocturnal urination.  Endo: Negative for unusual weight change.     Physical Exam   BP (!) 130/93 (BP Location: Left Arm, Patient Position: Sitting, Cuff Size: Normal)   Pulse (!) 116   Temp 97.7 F (36.5 C) (Temporal)   Ht 5' 6"  (1.676 m)   Wt 139 lb 12.8 oz (63.4 kg)   SpO2 99%   BMI 22.56 kg/m    General: Well-nourished, well-developed in no acute distress.  Eyes: No icterus. Mouth: Oropharyngeal mucosa moist and pink , no lesions erythema or exudate. Lungs: Clear to auscultation bilaterally.  Heart: Regular rate and rhythm, no murmurs rubs or gallops.  Abdomen: Bowel sounds are normal, nontender, nondistended, no hepatosplenomegaly or masses,  no abdominal bruits or hernia , no rebound or guarding.  Rectal: not performed Extremities: No lower extremity edema. No clubbing or deformities. Neuro: Alert and oriented x 4   Skin: Warm and dry, no jaundice.   Psych: Alert and cooperative, normal mood and affect.  Labs   See hpi  Imaging Studies   No results found.  Assessment   Crohn's colitis: doing well. Compliant with medications. Overdue for colonoscopy  for high risk screening, reassessing disease. They continue to decline.  IDA: improved on oral supplements but he stopped in 04/2021 after having several dental cavities.   Vitamin D deficiency: corrected, on maintenance dose. Yearly vitamin D levels advised by endo.  Osteopenia: last bone density study in 2019. Reviewed notes from Dr. Philemon Kingdom (endocrinology). Based on appropriate use of the Z score rather than T score due to his age, he has had only one value (in 2013) that would define low bone mineral density for age (Z value less than -2). His scans have been on different machines except his last two (done at [facility]). His Z scores have actually improved and higher than -2.0. advised to keep vitamin D level normal, check at least yearly. Decrease amount of milk and other dairy due to increase in blood acidity with subsequent exit of calcium from bones to buffer this. Advised to switch  to almond milk. Recommended repeat bone density scan in 4-5 years (2023-2024) at [facility].    PLAN   Due for labs. Continue folic acid, vitamin D, and 6-mp at current dosage. Plan for bone density study next year. Patient and mother request holding off colonoscopy. Return office visit in six months.     Laureen Ochs. Bobby Rumpf, [place], [facility]

## 2021-10-08 NOTE — Patient Instructions (Signed)
Please go for labs at your convenience. We will be in touch with results as available. Continue folic acid, vitamin D, and 6-mercaptopurine at current dosage. We will hold off on bone density study and colonoscopy as you have requested.  Return office visit in six months. Reach out with any questions or concerns.

## 2021-10-09 LAB — COMPREHENSIVE METABOLIC PANEL
AG Ratio: 1.7 (calc) (ref 1.0–2.5)
ALT: 13 U/L (ref 9–46)
AST: 14 U/L (ref 10–40)
Albumin: 4.5 g/dL (ref 3.6–5.1)
Alkaline phosphatase (APISO): 48 U/L (ref 36–130)
BUN: 10 mg/dL (ref 7–25)
CO2: 27 mmol/L (ref 20–32)
Calcium: 9.5 mg/dL (ref 8.6–10.3)
Chloride: 101 mmol/L (ref 98–110)
Creat: 0.86 mg/dL (ref 0.60–1.26)
Globulin: 2.6 g/dL (calc) (ref 1.9–3.7)
Glucose, Bld: 89 mg/dL (ref 65–139)
Potassium: 4.6 mmol/L (ref 3.5–5.3)
Sodium: 139 mmol/L (ref 135–146)
Total Bilirubin: 0.5 mg/dL (ref 0.2–1.2)
Total Protein: 7.1 g/dL (ref 6.1–8.1)

## 2021-10-09 LAB — CBC WITH DIFFERENTIAL/PLATELET
Absolute Monocytes: 346 cells/uL (ref 200–950)
Basophils Absolute: 38 cells/uL (ref 0–200)
Basophils Relative: 0.7 %
Eosinophils Absolute: 22 cells/uL (ref 15–500)
Eosinophils Relative: 0.4 %
HCT: 40.6 % (ref 38.5–50.0)
Hemoglobin: 13.5 g/dL (ref 13.2–17.1)
Lymphs Abs: 1085 cells/uL (ref 850–3900)
MCH: 33.1 pg — ABNORMAL HIGH (ref 27.0–33.0)
MCHC: 33.3 g/dL (ref 32.0–36.0)
MCV: 99.5 fL (ref 80.0–100.0)
MPV: 11.4 fL (ref 7.5–12.5)
Monocytes Relative: 6.4 %
Neutro Abs: 3910 cells/uL (ref 1500–7800)
Neutrophils Relative %: 72.4 %
Platelets: 436 10*3/uL — ABNORMAL HIGH (ref 140–400)
RBC: 4.08 10*6/uL — ABNORMAL LOW (ref 4.20–5.80)
RDW: 15.7 % — ABNORMAL HIGH (ref 11.0–15.0)
Total Lymphocyte: 20.1 %
WBC: 5.4 10*3/uL (ref 3.8–10.8)

## 2021-10-09 LAB — IRON,TIBC AND FERRITIN PANEL
%SAT: 19 % (calc) — ABNORMAL LOW (ref 20–48)
Ferritin: 8 ng/mL — ABNORMAL LOW (ref 38–380)
Iron: 62 ug/dL (ref 50–180)
TIBC: 325 mcg/dL (calc) (ref 250–425)

## 2021-10-09 LAB — SEDIMENTATION RATE: Sed Rate: 9 mm/h (ref 0–15)

## 2021-10-09 LAB — B12 AND FOLATE PANEL
Folate: >24 ng/mL
Vitamin B-12: 361 pg/mL (ref 200–1100)

## 2021-10-09 LAB — C-REACTIVE PROTEIN: CRP: 2.6 mg/L (ref <8.0)

## 2021-10-09 LAB — VITAMIN D 25 HYDROXY (VIT D DEFICIENCY, FRACTURES): Vit D, 25-Hydroxy: 64 ng/mL (ref 30–100)

## 2021-10-15 ENCOUNTER — Other Ambulatory Visit: Payer: Self-pay

## 2021-10-15 DIAGNOSIS — E611 Iron deficiency: Secondary | ICD-10-CM

## 2022-02-13 ENCOUNTER — Other Ambulatory Visit: Payer: Self-pay

## 2022-02-13 DIAGNOSIS — E611 Iron deficiency: Secondary | ICD-10-CM

## 2022-03-20 ENCOUNTER — Other Ambulatory Visit: Payer: Self-pay | Admitting: Gastroenterology

## 2022-04-01 LAB — CBC WITH DIFFERENTIAL/PLATELET
Absolute Monocytes: 516 cells/uL (ref 200–950)
Basophils Absolute: 42 cells/uL (ref 0–200)
Basophils Relative: 0.7 %
Eosinophils Absolute: 108 cells/uL (ref 15–500)
Eosinophils Relative: 1.8 %
HCT: 42.8 % (ref 38.5–50.0)
Hemoglobin: 14.7 g/dL (ref 13.2–17.1)
Lymphs Abs: 1464 cells/uL (ref 850–3900)
MCH: 34.1 pg — ABNORMAL HIGH (ref 27.0–33.0)
MCHC: 34.3 g/dL (ref 32.0–36.0)
MCV: 99.3 fL (ref 80.0–100.0)
MPV: 11.1 fL (ref 7.5–12.5)
Monocytes Relative: 8.6 %
Neutro Abs: 3870 cells/uL (ref 1500–7800)
Neutrophils Relative %: 64.5 %
Platelets: 409 10*3/uL — ABNORMAL HIGH (ref 140–400)
RBC: 4.31 10*6/uL (ref 4.20–5.80)
RDW: 14.2 % (ref 11.0–15.0)
Total Lymphocyte: 24.4 %
WBC: 6 10*3/uL (ref 3.8–10.8)

## 2022-04-01 LAB — IRON,TIBC AND FERRITIN PANEL
%SAT: 37 % (calc) (ref 20–48)
Ferritin: 18 ng/mL — ABNORMAL LOW (ref 38–380)
Iron: 103 ug/dL (ref 50–180)
TIBC: 278 mcg/dL (calc) (ref 250–425)

## 2022-04-10 ENCOUNTER — Other Ambulatory Visit: Payer: Self-pay

## 2022-04-10 ENCOUNTER — Encounter: Payer: Self-pay | Admitting: Gastroenterology

## 2022-04-10 ENCOUNTER — Ambulatory Visit (INDEPENDENT_AMBULATORY_CARE_PROVIDER_SITE_OTHER): Payer: Medicaid Other | Admitting: Gastroenterology

## 2022-04-10 VITALS — BP 121/82 | HR 132 | Temp 98.4°F | Ht 65.5 in | Wt 148.4 lb

## 2022-04-10 DIAGNOSIS — E611 Iron deficiency: Secondary | ICD-10-CM | POA: Diagnosis not present

## 2022-04-10 DIAGNOSIS — K5010 Crohn's disease of large intestine without complications: Secondary | ICD-10-CM | POA: Diagnosis not present

## 2022-04-10 MED ORDER — BENZONATATE 100 MG PO CAPS: ORAL_CAPSULE | ORAL | 2 refills | Status: DC

## 2022-04-10 MED ORDER — MERCAPTOPURINE 50 MG PO TABS: ORAL_TABLET | ORAL | 3 refills | Status: DC

## 2022-04-10 NOTE — Patient Instructions (Signed)
We will be in touch in 10/2022 to arrange for labs and bone density study. Continue medications as before. I have refilled your mercaptopurine and tessalon perles.  Continue high iron diet.  We will see you back in August after your labs and bone density study.  Let me know if you have any questions or concerns in the interim.  Thank you so much for the fun day trip lists!  It was a pleasure to see you today. I want to create trusting relationships with patients and provide genuine, compassionate, and quality care. I truly value your feedback, so please be on the lookout for a survey regarding your visit with me today. I appreciate your time in completing this!

## 2022-04-10 NOTE — Progress Notes (Signed)
GI Office Note    Referring Provider: [name], MD Primary Care Physician:  [name], MD  Primary Gastroenterologist: Elon Alas. Abbey Chatters, DO   Chief Complaint   Chief Complaint  Patient presents with   Follow-up    Doing well.     History of Present Illness   Tanner Bradley is a 32 y.o. male presenting today for follow up. Last seen in 10/2021. He has a history of Crohn's colitis diagnosed in 2008, chronically managed on 6-MP, folic acid. Only colonoscopy was done at time of diagnosis by Dr. Carlis Abbott.   He has a history of both iron and vitamin D deficiency which has been corrected with oral supplementation and/or diet. He did not tolerate oral iron due to numerous cavities. We have been trying to encourage patient to complete a colonoscopy to evaluate for disease activity, restaging, colon cancer screening but patient and mother have declined several times. He has a history of seizure thought to be related to significant stress. Currently off seizure medication for nearly one year. Patient is very reluctant to having colonoscopy due to anesthesia and he fears the stress may trigger a seizure. He also points out that he was supposed to follow up with his neurologist this week but he found out the office is permanently closed. Right now they are declining referral to neurology. He also has history of osteopenia with last bone density study in 2019. He was seen by endocrinologist since then and it was determined that if Z score rather than T score was used "due to age" his most recent scans were within normal range. He is due updated bone density study this year.   Today: weight up 9 pounds since 10/2021. Eating well. Eating high iron foods. Eating more red meat, suggested limiting to few times per week. His energy level is great. He has no bowel concerns. No abdominal pain. No melena, brbpr. No heartburn, dysphagia, vomiting. He is enjoying taking day trips on the weekends with his  family.    Medications   Current Outpatient Medications  Medication Sig Dispense Refill   acetaminophen (TYLENOL) 500 MG tablet Take 500 mg by mouth as needed for pain.     benzonatate (TESSALON PERLES) 100 MG capsule 1 PO TID FOR 7 DAYS WHEN WHEN NEEDED FOR COUGH 60 capsule 5   budesonide-formoterol (SYMBICORT) 80-4.5 MCG/ACT inhaler Inhale 2 puffs into the lungs 2 (two) times daily. FOR 7 DAYS THEN 2 PUFFS DAILY FOR 7 DAYS (Patient taking differently: Inhale 2 puffs into the lungs 2 (two) times daily. As needed) 1 Inhaler 11   calcium citrate (CALCITRATE - DOSED IN MG ELEMENTAL CALCIUM) 950 MG tablet Take 2 tablets by mouth daily.     cholecalciferol (VITAMIN D3) 25 MCG (1000 UT) tablet Take 3 tablets (3,000 Units total) by mouth daily. 90 tablet 11   ergocalciferol (VITAMIN D2) 1.25 MG (50000 UT) capsule Take 50,000 Units by mouth once a week.     folic acid (FOLVITE) 1 MG tablet TAKE 1 TABLET(1 MG) BY MOUTH DAILY 90 tablet 3   mercaptopurine (PURINETHOL) 50 MG tablet TAKE 1 TABLET EVERY DAY ON EMPTY STOMACH 90 tablet 3   polyethylene glycol powder (GLYCOLAX/MIRALAX) powder Take 17 g by mouth daily. (Patient taking differently: Take 17 g by mouth as needed.) 527 g 3   Probiotic CAPS 1 PO DAILY. 30 capsule 11   Sodium Fluoride (PREVIDENT 5000 BOOSTER PLUS) 1.1 % PSTE Place onto teeth at bedtime.  Spacer/Aero-Holding Chambers (AEROCHAMBER MV) inhaler Use WITH INHALER (Patient taking differently: as needed. Use WITH INHALER) 1 each 0   Wheat Dextrin (BENEFIBER) POWD 1 SCOOP PO TID (Patient taking differently: as needed. 1 SCOOP PO TID) 730 g 11   No current facility-administered medications for this visit.    Allergies   Allergies as of 04/10/2022 - Review Complete 04/10/2022  Allergen Reaction Noted   Other Other (See Comments) 01/08/2013   Amoxicillin Rash 10/16/2010            Review of Systems   General: Negative for anorexia, weight loss, fever, chills, fatigue,  weakness. ENT: Negative for hoarseness, difficulty swallowing , nasal congestion. CV: Negative for chest pain, angina, palpitations, dyspnea on exertion, peripheral edema.  Respiratory: Negative for dyspnea at rest, dyspnea on exertion, cough, sputum, wheezing.  GI: See history of present illness. GU:  Negative for dysuria, hematuria, urinary incontinence, urinary frequency, nocturnal urination.  Endo: Negative for unusual weight change.     Physical Exam   BP 121/82 (BP Location: Right Arm, Patient Position: Sitting, Cuff Size: Normal)   Pulse (!) 132   Temp 98.4 F (36.9 C) (Oral)   Ht 5' 5.5" (1.664 m)   Wt 148 lb 6.4 oz (67.3 kg)   SpO2 96%   BMI 24.32 kg/m    General: Well-nourished, well-developed in no acute distress.  Eyes: No icterus. Mouth: Oropharyngeal mucosa moist and pink  .  Abdomen: Bowel sounds are normal, nontender, nondistended, no hepatosplenomegaly or masses,  no abdominal bruits or hernia , no rebound or guarding.  Rectal: not performed Extremities: No lower extremity edema. No clubbing or deformities. Neuro: Alert and oriented x 4   Skin: Warm and dry, no jaundice.   Psych: Alert and cooperative, normal mood and affect.  Labs   Lab Results  Component Value Date   CREATININE 0.86 10/08/2021   BUN 10 10/08/2021   NA 139 10/08/2021   K 4.6 10/08/2021   CL 101 10/08/2021   CO2 27 10/08/2021   Lab Results  Component Value Date   ALT 13 10/08/2021   AST 14 10/08/2021   ALKPHOS 48 10/08/2021   BILITOT 0.5 10/08/2021   Lab Results  Component Value Date   WBC 6.0 03/31/2022   HGB 14.7 03/31/2022   HCT 42.8 03/31/2022   MCV 99.3 03/31/2022   PLT 409 (H) 03/31/2022    Imaging Studies   No results found.  Assessment   Crohn's colitis: doing well. Compliant with medications. He remains resistant to high risk screening colonoscopy.   IDA: continues to improve with increased dietary iron. He previously did not tolerate oral iron due to several  dental cavities.   Vit D deficiency: checking vitamin D yearly. Due 10/2022.   Osteopenia: Reviewed notes from Dr. Philemon Kingdom (endocrinology). Based on appropriate use of the Z score rather than T score due to his age, he has had only one value (in 2013) that would define low bone mineral density for age (Z value less than -2). His scans have been on different machines except his last two (done at [facility]). His Z scores have actually improved and higher than -2.0. advised to keep vitamin D level normal, check at least yearly. Decrease amount of milk and other dairy due to increase in blood acidity with subsequent exit of calcium from bones to buffer this. Advised to switch to almond milk. Recommended repeat bone density scan in 4-5 years (2023-2024) at [facility].     PLAN  Bone density study in 10/2022.  Labs in 10/2022: vit D, iron/tibc/ferritin, CBC, B12, folate.  Continue folic acid, 6-mp, vitamin D.  Offered colonoscopy, patient and mother decline.  Return ov in 10/2022.   Laureen Ochs. Bobby Rumpf, [place], [facility]

## 2022-05-25 ENCOUNTER — Ambulatory Visit: Payer: Medicaid Other | Admitting: Neurology

## 2022-05-25 ENCOUNTER — Encounter: Payer: Self-pay | Admitting: Neurology

## 2022-05-25 VITALS — BP 136/91 | HR 109 | Ht 65.0 in | Wt 150.0 lb

## 2022-05-25 DIAGNOSIS — G809 Cerebral palsy, unspecified: Secondary | ICD-10-CM | POA: Diagnosis not present

## 2022-05-25 DIAGNOSIS — G40909 Epilepsy, unspecified, not intractable, without status epilepticus: Secondary | ICD-10-CM

## 2022-05-25 MED ORDER — NAYZILAM 5 MG/0.1ML NA SOLN: 5.0000 mg | NASAL | 0 refills | Status: DC | PRN

## 2022-05-25 NOTE — Progress Notes (Signed)
[facility]  PATIENT: Tanner Bradley DOB: April 26, 1990  REQUESTING CLINICIAN: Phillips Odor, MD HISTORY FROM: Patient, mother and chart review  REASON FOR VISIT: Here to establish care    HISTORICAL  CHIEF COMPLAINT:  Chief Complaint  Patient presents with   New Patient (Initial Visit)    Rm 12. Accompanied by mom. NP Paper referral for Epilepsy TOC / [facility].    HISTORY OF PRESENT ILLNESS:  This is a 32 year old gentleman with history of cerebral palsy, chronic disease, seizure disorder who is presenting to establish care. History obtained by patient, mother and chart review.   In brief patient had neonatal seizures, mother reports that she has a very complicated delivery, patient was in NICU for about 2 weeks.  Again he was having seizures from delivery until 74 months of age.  Then he did very well until May 2019 when family woke up to patient having a generalized seizure, they report prior to the seizure he was under a lot of stress, his grandmother was diagnosed with dementia and was living with them and he was also played a new video game with lots of flashing bright lights.  He was taken to the hospital and started on Levetiracetam. They follow-up with Dr. Delorse Limber on levetiracetam 500 mg twice daily.  He did well until last year when they start weaning the medication.  The last Keppra dose was taken on May 13, 2021.  He has not had any seizures.  Overall he is doing well.   Handedness: Left hand   Onset: as an infant and then March 2019  Seizure Type: Generalized convulsion   Current frequency: Last on in March 2019, prior to that was as an infant   Any injuries from seizures: Denies   Seizure risk factors: Cerebral palsy   Previous ASMs: Levetiracetam    Currenty ASMs: None   ASMs side effects: Denies   Brain Images: Normal brain CT 2019  Previous EEGs: Normal EEG 2019   OTHER MEDICAL CONDITIONS: Cerebral  palsy, Chron's disease, Seizure   REVIEW OF SYSTEMS: Full 14 system review of systems performed and negative with exception of: As noted in the HPI   ALLERGIES: Allergies  Allergen Reactions   Other Other (See Comments)    Certain antibiotics CAN NOT be taken due to Crohn's disease    Amoxicillin Rash    HOME MEDICATIONS: Outpatient Medications Prior to Visit  Medication Sig Dispense Refill   acetaminophen (TYLENOL) 500 MG tablet Take 500 mg by mouth as needed for pain.     budesonide-formoterol (SYMBICORT) 80-4.5 MCG/ACT inhaler Inhale 2 puffs into the lungs 2 (two) times daily. FOR 7 DAYS THEN 2 PUFFS DAILY FOR 7 DAYS (Patient taking differently: Inhale 2 puffs into the lungs 2 (two) times daily. As needed) 1 Inhaler 11   calcium citrate (CALCITRATE - DOSED IN MG ELEMENTAL CALCIUM) 950 MG tablet Take 2 tablets by mouth daily.     cholecalciferol (VITAMIN D3) 25 MCG (1000 UT) tablet Take 3 tablets (3,000 Units total) by mouth daily. 90 tablet 11   folic acid (FOLVITE) 1 MG tablet TAKE 1 TABLET(1 MG) BY MOUTH DAILY 90 tablet 3   mercaptopurine (PURINETHOL) 50 MG tablet TAKE 1 TABLET EVERY DAY ON EMPTY STOMACH 90 tablet 3   polyethylene glycol powder (GLYCOLAX/MIRALAX) powder Take 17 g by mouth daily. (Patient taking differently: Take 17 g by mouth as needed.) 527 g 3   Probiotic CAPS 1 PO DAILY. 30 capsule  11   Sodium Fluoride (PREVIDENT 5000 BOOSTER PLUS) 1.1 % PSTE Place onto teeth at bedtime.     Spacer/Aero-Holding Chambers (AEROCHAMBER MV) inhaler Use WITH INHALER (Patient taking differently: as needed. Use WITH INHALER) 1 each 0   Wheat Dextrin (BENEFIBER) POWD 1 SCOOP PO TID (Patient taking differently: as needed. 1 SCOOP PO TID) 730 g 11   benzonatate (TESSALON PERLES) 100 MG capsule 1 PO TID FOR 7 DAYS WHEN WHEN NEEDED FOR COUGH (Patient not taking: Reported on 05/25/2022) 60 capsule 2   ergocalciferol (VITAMIN D2) 1.25 MG (50000 UT) capsule Take 50,000 Units by mouth once a week.      No facility-administered medications prior to visit.    PAST MEDICAL HISTORY: Past Medical History:  Diagnosis Date   C. difficile colitis NOV 2013   Cerebral palsy ([place])    Crohn's disease ([place]) 2008 TCS POS SBFT NL   Rx: Prednisone then 6-MP   Osteopenia    Seizures ([place]) 1992   Vitamin D deficiency     PAST SURGICAL HISTORY: Past Surgical History:  Procedure Laterality Date   COLONOSCOPY  2008   COLITIS, bX: POS GRANULOMA-->CD    FAMILY HISTORY: Family History  Problem Relation Age of Onset   Colon cancer Neg Hx    Colon polyps Neg Hx    Crohn's disease Neg Hx     SOCIAL HISTORY: Social History   Socioeconomic History   Marital status: Single    Spouse name: Not on file   Number of children: Not on file   Years of education: Not on file   Highest education level: Not on file  Occupational History   Not on file  Tobacco Use   Smoking status: Never   Smokeless tobacco: Never  Substance and Sexual Activity   Alcohol use: No   Drug use: No   Sexual activity: Not Currently  Other Topics Concern   Not on file  Social History Narrative   [place]. HAS ONE SISTER.   MOM NOW DRIVES.   Social Determinants of Health   Financial Resource Strain: Not on file  Food Insecurity: Not on file  Transportation Needs: Not on file  Physical Activity: Not on file  Stress: Not on file  Social Connections: Not on file  Intimate Partner Violence: Not on file    PHYSICAL EXAM  GENERAL EXAM/CONSTITUTIONAL: Vitals:  Vitals:   05/25/22 0814  BP: (!) 136/91  Pulse: (!) 109  Weight: 150 lb (68 kg)  Height: 5\' 5"  (1.651 m)   Body mass index is 24.96 kg/m. Wt Readings from Last 3 Encounters:  05/25/22 150 lb (68 kg)  04/10/22 148 lb 6.4 oz (67.3 kg)  10/08/21 139 lb 12.8 oz (63.4 kg)   Patient is in no distress; well developed, nourished and groomed; neck is supple  EYES: Visual fields full to confrontation, Extraocular movements intacts,  No  results found.  MUSCULOSKELETAL: Gait, strength, tone, movements noted in Neurologic exam below  NEUROLOGIC: MENTAL STATUS:      No data to display         awake, alert, oriented to person, place and time Able to provide some history  Able to answer simple question, able to calculate the number of quarter in 1 dollar.  language fluent, comprehension intact, naming intact  CRANIAL NERVE:  2nd, 3rd, 4th, 6th - Visual fields full to confrontation, extraocular muscles intact, no nystagmus 5th - facial sensation symmetric 7th - facial strength symmetric 8th - hearing  intact 9th - palate elevates symmetrically, uvula midline 11th - shoulder shrug symmetric 12th - tongue protrusion midline  MOTOR:  normal bulk and tone, full strength in the BUE, BLE  SENSORY:  normal and symmetric to light touch  COORDINATION:  finger-nose-finger, fine finger movements normal  REFLEXES:  Brisk reflexes throughout   GAIT/STATION:  Mild scissoring gait    DIAGNOSTIC DATA (LABS, IMAGING, TESTING) - I reviewed patient records, labs, notes, testing and imaging myself where available.  Lab Results  Component Value Date   WBC 6.0 03/31/2022   HGB 14.7 03/31/2022   HCT 42.8 03/31/2022   MCV 99.3 03/31/2022   PLT 409 (H) 03/31/2022      Component Value Date/Time   NA 139 10/08/2021 1308   K 4.6 10/08/2021 1308   CL 101 10/08/2021 1308   CO2 27 10/08/2021 1308   GLUCOSE 89 10/08/2021 1308   BUN 10 10/08/2021 1308   CREATININE 0.86 10/08/2021 1308   CALCIUM 9.5 10/08/2021 1308   PROT 7.1 10/08/2021 1308   ALBUMIN 4.1 08/01/2017 0814   AST 14 10/08/2021 1308   ALT 13 10/08/2021 1308   ALKPHOS 85 08/01/2017 0814   BILITOT 0.5 10/08/2021 1308   GFRNONAA 118 08/08/2018 1442   GFRAA 137 08/08/2018 1442   No results found for: "CHOL", "HDL", "LDLCALC", "LDLDIRECT", "TRIG" No results found for: "HGBA1C" Lab Results  Component Value Date   VITAMINB12 361 10/08/2021   Lab Results   Component Value Date   TSH 3.23 07/16/2017    CT Head 2019 Normal examination.   Normal routine EEG 2019  I personally reviewed brain Images.  ASSESSMENT AND PLAN  32 y.o. year old male  with cerebral palsy, seizure disorder, chron's disease, vitamin D deficiency who is presenting to establish care for his seizure disorder.  Patient has a history of cerebral palsy, had neonatal seizures until the age of 3 months then did well until June 04, 2017 when he had a breakthrough seizure.  Since then, he has not had any additional seizures.  Currently he is off Bricelyn for about a year and has been doing well.  Plan for patient will be to remain off Keppra, I also gave them a Nayzilam as rescue medication in case he had a breakthrough seizure.  Advised mother to contact me if patient has a breakthrough seizure otherwise he can continue to follow with PCP and return as needed.  They voiced understanding and are comfortable with plan.   1. Seizure disorder ([place])   2. Cerebral palsy, unspecified type [facility])     Patient Instructions  Continue your current medications  Return as needed    Per [facility] statutes, patients with seizures are not allowed to drive until they have been seizure-free for six months.  Other recommendations include using caution when using heavy equipment or power tools. Avoid working on ladders or at heights. Take showers instead of baths.  Do not swim alone.  Ensure the water temperature is not too high on the home water heater. Do not go swimming alone. Do not lock yourself in a room alone (i.e. bathroom). When caring for infants or small children, sit down when holding, feeding, or changing them to minimize risk of injury to the child in the event you have a seizure. Maintain good sleep hygiene. Avoid alcohol.  Also recommend adequate sleep, hydration, good diet and minimize stress.   During the Seizure  - First, ensure adequate ventilation and place patients on  the floor on their left side  Loosen clothing around the neck and ensure the airway is patent. If the patient is clenching the teeth, do not force the mouth open with any object as this can cause severe damage - Remove all items from the surrounding that can be hazardous. The patient may be oblivious to what's happening and may not even know what he or she is doing. If the patient is confused and wandering, either gently guide him/her away and block access to outside areas - Reassure the individual and be comforting - Call 911. In most cases, the seizure ends before EMS arrives. However, there are cases when seizures may last over 3 to 5 minutes. Or the individual may have developed breathing difficulties or severe injuries. If a pregnant patient or a person with diabetes develops a seizure, it is prudent to call an ambulance. - Finally, if the patient does not regain full consciousness, then call EMS. Most patients will remain confused for about 45 to 90 minutes after a seizure, so you must use judgment in calling for help. - Avoid restraints but make sure the patient is in a bed with padded side rails - Place the individual in a lateral position with the neck slightly flexed; this will help the saliva drain from the mouth and prevent the tongue from falling backward - Remove all nearby furniture and other hazards from the area - Provide verbal assurance as the individual is regaining consciousness - Provide the patient with privacy if possible - Call for help and start treatment as ordered by the caregiver   After the Seizure (Postictal Stage)  After a seizure, most patients experience confusion, fatigue, muscle pain and/or a headache. Thus, one should permit the individual to sleep. For the next few days, reassurance is essential. Being calm and helping reorient the person is also of importance.  Most seizures are painless and end spontaneously. Seizures are not harmful to others but can lead to  complications such as stress on the lungs, brain and the heart. Individuals with prior lung problems may develop labored breathing and respiratory distress.     No orders of the defined types were placed in this encounter.   No orders of the defined types were placed in this encounter.   Return if symptoms worsen or fail to improve.    Alric Ran, MD 05/25/2022, 8:50 AM  [facility] [address], [place], [place] [address] [phone]

## 2022-05-25 NOTE — Patient Instructions (Signed)
Continue your current medications  Return as needed

## 2022-07-01 ENCOUNTER — Ambulatory Visit: Payer: Medicaid Other | Admitting: Family Medicine

## 2022-07-01 ENCOUNTER — Encounter: Payer: Self-pay | Admitting: Family Medicine

## 2022-07-01 VITALS — BP 124/87 | HR 134 | Temp 98.2°F | Ht 65.0 in | Wt 147.0 lb

## 2022-07-01 DIAGNOSIS — Z8669 Personal history of other diseases of the nervous system and sense organs: Secondary | ICD-10-CM

## 2022-07-01 DIAGNOSIS — K5010 Crohn's disease of large intestine without complications: Secondary | ICD-10-CM

## 2022-07-01 NOTE — Patient Instructions (Signed)
Continue his current medications.   I will reach out to GI.  Follow up in 6 months.

## 2022-07-01 NOTE — Progress Notes (Signed)
Subjective:  Patient ID: Tanner Bradley, male    DOB: October 12, 1990  Age: 32 y.o. MRN: [id number]  CC: Chief Complaint  Patient presents with   Establish Care    HPI:  32 year old male with Crohn's disease, iron deficiency anemia, cerebral palsy, history of seizure disorder presents to establish care.  Patient is doing well at this time.  He is no longer on seizure medication.  He has recently seen neurology.  Patient follows closely with GI.  He was diagnosed with Crohn's in 2008.  GI has been encouraging him to get a colonoscopy but mother and patient have not wanted to proceed with this.  He is currently on mercaptopurine and has been doing well.  Reportedly when he presented he had diarrhea and anorexia and weight loss.  He had no abdominal pain.  Will discuss colonoscopy today.  Last CBC showed a normal hemoglobin of 14.7.  His ferritin has been low at 18.  He does not tolerate oral iron therapy.  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 HAS ONE SISTER.   MOM NOW DRIVES.   Social Determinants of Health   Financial Resource Strain: Not on file  Food Insecurity: Not on file  Transportation Needs: Not on file  Physical  Activity: Not on file  Stress: Not on file  Social Connections: Not on file    Review of Systems  Constitutional: Negative.   Gastrointestinal: Negative.     Objective:  BP 124/87   Pulse (!) 134   Temp 98.2 F (36.8 C)   Ht  (1.651 m)   Wt 147 lb (66.7 kg)   SpO2 96%   BMI 24.46 kg/m      07/01/2022    1:36 PM 05/25/2022    8:14 AM 04/10/2022    9:55 AM  BP/Weight  Systolic BP 124 136 121  Diastolic BP 87 91 82  Wt. (Lbs) 147 150 148.4  BMI 24.46 kg/m2 24.96 kg/m2 24.32 kg/m2    Physical Exam Vitals and nursing note reviewed.  Constitutional:      General: He is not in acute distress. HENT:     Head: Normocephalic and atraumatic.  Eyes:     General:        Right eye: No discharge.        Left eye: No discharge.     Conjunctiva/sclera: Conjunctivae normal.  Cardiovascular:     Rate and Rhythm: Regular rhythm. Tachycardia present.  Pulmonary:     Effort: Pulmonary effort is normal.     Breath sounds: Normal breath sounds. No wheezing or rales.  Abdominal:  General: There is no distension.     Palpations: Abdomen is soft.     Tenderness: There is no abdominal tenderness.  Neurological:     Mental Status: He is alert.  Psychiatric:        Mood and Affect: Mood normal.        Behavior: Behavior normal.     Lab Results  Component Value Date   WBC 6.0 03/31/2022   HGB 14.7 03/31/2022   HCT 42.8 03/31/2022   PLT 409 (H) 03/31/2022   GLUCOSE 89 10/08/2021   ALT 13 10/08/2021   AST 14 10/08/2021   NA 139 10/08/2021   K 4.6 10/08/2021   CL 101 10/08/2021   CREATININE 0.86 10/08/2021   BUN 10 10/08/2021   CO2 27 10/08/2021   TSH 3.23 07/16/2017     Assessment & Plan:   Problem List Items Addressed This Visit       Digestive   Crohn's disease of colon - Primary    Stable.  Recommended colonoscopy.  I reached out to GI, Tana Coast.  I discussed with her that they are amendable to colonoscopy with a condition that if it is normal there is  a consideration for discontinuing/tapering off medication.  Awaiting response back.        Other   History of seizure disorder    Stable.  Currently off medication.  Will continue to monitor closely.       Follow-up:  Return in about 6 months (around 12/31/2022).  Everlene Other DO [facility]

## 2022-07-01 NOTE — Assessment & Plan Note (Signed)
Stable.  Currently off medication.  Will continue to monitor closely.

## 2022-07-01 NOTE — Assessment & Plan Note (Addendum)
Stable.  Recommended colonoscopy.  I reached out to GI, Tana Coast.  I discussed with her that they are amendable to colonoscopy with a condition that if it is normal there is a consideration for discontinuing/tapering off medication.  Awaiting response back.

## 2022-08-05 ENCOUNTER — Telehealth: Payer: Self-pay

## 2022-08-05 NOTE — Telephone Encounter (Signed)
Pt's mother phoned wanting 2 different appts with Verlon Au and Dr Marletta Lor. Pt is already scheduled to see Verlon Au on October 16, 2022. I advised the pt mother that it will be unusual to have 2 different appts for the same thing. She stated that there was things she needed to go over with Dr Marletta Lor that she had concerns from regarding what she was told by PCP but she did not want to cancel appt with Verlon Au. If Dr Marletta Lor is booked for next couple of months then she will keep appt with Verlon Au. Again I expressed to the pt's mother that I cannot schedule appts like this I needed to pass this to the Dr's. Pt's mom stated she would call back and speak with the front desk.

## 2022-08-06 NOTE — Telephone Encounter (Signed)
Please make appt with Dr. Marletta Lor per patient/mother's request. Dr. Adriana Simas has spoken to them about possibility of coming of mercaptopurine if colonoscopy negative. May be what they want to discuss.

## 2022-08-07 NOTE — Telephone Encounter (Signed)
noted 

## 2022-08-10 NOTE — Telephone Encounter (Signed)
Sent the pt a MyChart message regarding our waiting on Dr Darolyn Rua June and July schedule.

## 2022-08-10 NOTE — Telephone Encounter (Signed)
Added patient to JULY recall to see Dr Marletta Lor per LSL

## 2022-08-10 NOTE — Telephone Encounter (Signed)
noted 

## 2022-08-19 ENCOUNTER — Other Ambulatory Visit: Payer: Self-pay | Admitting: Gastroenterology

## 2022-09-21 ENCOUNTER — Telehealth (INDEPENDENT_AMBULATORY_CARE_PROVIDER_SITE_OTHER): Payer: Self-pay | Admitting: *Deleted

## 2022-09-21 NOTE — Telephone Encounter (Signed)
He is on folic acid 1 mg.  If I dose in mcg, I will need to provide both 800 mcg (1 tablet daily) and 400 mcg (1/2 tablet daily). Can we let patient's mother know of this change and make sure she is okay with it. Other option is they buy 1mg  out of pocket.

## 2022-09-21 NOTE — Telephone Encounter (Signed)
Fax from [facility]  [address] requesting change in med. Pt taking folic acid 1mg  tablets and drug is not covered by patient plan. The preferred alternative is folic acid tab mcg.  Pt last seen by Verlon Au on 04/10/22.

## 2022-09-22 NOTE — Telephone Encounter (Signed)
Thank you :)

## 2022-09-22 NOTE — Telephone Encounter (Signed)
Discussed with pt's mother Lynden Ang and she states she had been paying out of pocket for the 1mg  tablet. She is not sure why [facility] sent that request and she states they recently picked up folic acid and do not need anything at this time. Will keep paying out of pocket for 1mg  tablet.

## 2022-10-05 ENCOUNTER — Other Ambulatory Visit: Payer: Self-pay

## 2022-10-05 ENCOUNTER — Other Ambulatory Visit (INDEPENDENT_AMBULATORY_CARE_PROVIDER_SITE_OTHER): Payer: Self-pay | Admitting: *Deleted

## 2022-10-05 DIAGNOSIS — K5010 Crohn's disease of large intestine without complications: Secondary | ICD-10-CM

## 2022-10-05 DIAGNOSIS — E611 Iron deficiency: Secondary | ICD-10-CM

## 2022-10-05 DIAGNOSIS — D75839 Thrombocytosis, unspecified: Secondary | ICD-10-CM

## 2022-10-05 DIAGNOSIS — E559 Vitamin D deficiency, unspecified: Secondary | ICD-10-CM

## 2022-10-07 ENCOUNTER — Encounter: Payer: Self-pay | Admitting: Internal Medicine

## 2022-10-07 ENCOUNTER — Other Ambulatory Visit: Payer: Self-pay | Admitting: Internal Medicine

## 2022-10-07 ENCOUNTER — Ambulatory Visit (INDEPENDENT_AMBULATORY_CARE_PROVIDER_SITE_OTHER): Payer: Medicaid Other | Admitting: Internal Medicine

## 2022-10-07 ENCOUNTER — Telehealth: Payer: Self-pay

## 2022-10-07 VITALS — BP 127/86 | HR 112 | Temp 98.1°F | Ht 65.0 in | Wt 143.8 lb

## 2022-10-07 DIAGNOSIS — E611 Iron deficiency: Secondary | ICD-10-CM | POA: Diagnosis not present

## 2022-10-07 DIAGNOSIS — K5010 Crohn's disease of large intestine without complications: Secondary | ICD-10-CM | POA: Diagnosis not present

## 2022-10-07 DIAGNOSIS — E559 Vitamin D deficiency, unspecified: Secondary | ICD-10-CM

## 2022-10-07 DIAGNOSIS — M8580 Other specified disorders of bone density and structure, unspecified site: Secondary | ICD-10-CM | POA: Diagnosis not present

## 2022-10-07 MED ORDER — MERCAPTOPURINE 50 MG PO TABS: ORAL_TABLET | ORAL | 3 refills | Status: DC

## 2022-10-07 MED ORDER — FOLIC ACID 1 MG PO TABS: ORAL_TABLET | ORAL | 3 refills | Status: DC

## 2022-10-07 MED ORDER — BENZONATATE 100 MG PO CAPS: ORAL_CAPSULE | ORAL | 2 refills | Status: DC

## 2022-10-07 NOTE — Progress Notes (Signed)
Referring Provider: Tommie Sams, DO Primary Care Physician:  Tommie Sams, DO Primary GI:  Dr. Marletta Lor  Chief Complaint  Patient presents with   Crohn's Disease    Follow up on Crohn's. Needs refills on mercaptopurine and folic acid. States doing well     HPI:   Tanner Bradley is a 32 y.o. male who presents to the clinic today for follow-up visit.  Has a history of Crohn's colitis diagnosed in 2018.  Chronically managed on 6-MP and folic acid.  States he is doing well.  No melena hematochezia.  No abdominal pain.  No diarrhea.  No mucus in his stool.  Has actually gained 10 pounds since his visit in February.  Only colonoscopy at time of diagnosis by Dr. Chestine Spore.    He has a history of both iron and vitamin D deficiency which has been corrected with oral supplementation and/or diet. He did not tolerate oral iron due to numerous cavities.   Blood work 10/05/2022 showed normal iron, ferritin, vitamin D, folate, CBC.  We have been trying to encourage patient to complete a colonoscopy to evaluate for disease activity, restaging, colon cancer screening but patient and mother have declined several times.   Osteopenia: Seen by Dr. Carlus Pavlov (endocrinology). Based on appropriate use of the Z score rather than T score due to his age, he has had only one value (in 2013) that would define low bone mineral density for age (Z value less than -2). His scans have been on different machines except his last two (done at [facility]). His Z scores have actually improved and higher than -2.0. advised to keep vitamin D level normal, check at least yearly. Decrease amount of milk and other dairy due to increase in blood acidity with subsequent exit of calcium from bones to buffer this. Advised to switch to almond milk. Recommended repeat bone density scan in 4-5 years (2023-2024) at [facility].    Today, states he is doing well.  Bowel movements normal.  No melena hematochezia.  No mucus in the stools.  Overall feels  great.  Past Medical History:  Diagnosis Date   C. difficile colitis NOV 2013   Cerebral palsy (HCC)    Crohn's disease (HCC) 2008 TCS POS SBFT NL   Rx: Prednisone then 6-MP   Osteopenia    Seizures (HCC) 1992   Vitamin D deficiency     Past Surgical History:  Procedure Laterality Date   COLONOSCOPY  2008   COLITIS, bX: POS GRANULOMA-->CD    Current Outpatient Medications  Medication Sig Dispense Refill   acetaminophen (TYLENOL) 500 MG tablet Take 500 mg by mouth as needed for pain.     benzonatate (TESSALON PERLES) 100 MG capsule 1 PO TID FOR 7 DAYS WHEN WHEN NEEDED FOR COUGH 60 capsule 2   budesonide-formoterol (SYMBICORT) 80-4.5 MCG/ACT inhaler Inhale 2 puffs into the lungs 2 (two) times daily. FOR 7 DAYS THEN 2 PUFFS DAILY FOR 7 DAYS (Patient taking differently: Inhale 2 puffs into the lungs 2 (two) times daily. As needed) 1 Inhaler 11   calcium citrate (CALCITRATE - DOSED IN MG ELEMENTAL CALCIUM) 950 MG tablet Take 2 tablets by mouth daily.     cholecalciferol (VITAMIN D3) 25 MCG (1000 UT) tablet Take 3 tablets (3,000 Units total) by mouth daily. 90 tablet 11   folic acid (FOLVITE) 1 MG tablet TAKE 1 TABLET(1 MG) BY MOUTH DAILY 90 tablet 3   mercaptopurine (PURINETHOL) 50 MG tablet TAKE 1 TABLET  EVERY DAY ON EMPTY STOMACH 90 tablet 3   Midazolam (NAYZILAM) 5 MG/0.1ML SOLN Place 5 mg into the nose as needed (for seizure). 2 each 0   polyethylene glycol powder (GLYCOLAX/MIRALAX) powder Take 17 g by mouth daily. (Patient taking differently: Take 17 g by mouth as needed.) 527 g 3   Probiotic CAPS 1 PO DAILY. 30 capsule 11   Sodium Fluoride (PREVIDENT 5000 BOOSTER PLUS) 1.1 % PSTE Place onto teeth at bedtime.     Spacer/Aero-Holding Chambers (AEROCHAMBER MV) inhaler Use WITH INHALER (Patient taking differently: as needed. Use WITH INHALER) 1 each 0   Wheat Dextrin (BENEFIBER) POWD 1 SCOOP PO TID (Patient taking differently: as needed. 1 SCOOP PO TID) 730 g 11   No current  facility-administered medications for this visit.    Allergies as of 10/07/2022 - Review Complete 10/07/2022  Allergen Reaction Noted   Other Other (See Comments) 01/08/2013   Amoxicillin Rash 10/16/2010    Family History  Problem Relation Age of Onset   Colon cancer Neg Hx    Colon polyps Neg Hx    Crohn's disease Neg Hx     Social History   Socioeconomic History   Marital status: Single    Spouse name: Not on file   Number of children: Not on file   Years of education: Not on file   Highest education level: Not on file  Occupational History   Not on file  Tobacco Use   Smoking status: Never    Passive exposure: Never   Smokeless tobacco: Never  Substance and Sexual Activity   Alcohol use: No   Drug use: No   Sexual activity: Not Currently  Other Topics Concern   Not on file  Social History Narrative   WAS HOME SCHOOLED. HAS ONE SISTER.   MOM NOW DRIVES.   Social Determinants of Health   Financial Resource Strain: Not on file  Food Insecurity: Not on file  Transportation Needs: Not on file  Physical Activity: Not on file  Stress: Not on file  Social Connections: Not on file    Subjective: Review of Systems  Constitutional:  Negative for chills and fever.  HENT:  Negative for congestion and hearing loss.   Eyes:  Negative for blurred vision and double vision.  Respiratory:  Negative for cough and shortness of breath.   Cardiovascular:  Negative for chest pain and palpitations.  Gastrointestinal:  Negative for abdominal pain, blood in stool, constipation, diarrhea, heartburn, melena and vomiting.  Genitourinary:  Negative for dysuria and urgency.  Musculoskeletal:  Negative for joint pain and myalgias.  Skin:  Negative for itching and rash.  Neurological:  Negative for dizziness and headaches.  Psychiatric/Behavioral:  Negative for depression. The patient is not nervous/anxious.      Objective: BP 127/86 (BP Location: Left Arm, Patient Position:  Sitting, Cuff Size: Normal)   Pulse (!) 112   Temp 98.1 F (36.7 C) (Oral)   Ht 5\' 5"  (1.651 m)   Wt 143 lb 12.8 oz (65.2 kg)   BMI 23.93 kg/m  Physical Exam Constitutional:      Appearance: Normal appearance.  HENT:     Head: Normocephalic and atraumatic.  Eyes:     Extraocular Movements: Extraocular movements intact.     Conjunctiva/sclera: Conjunctivae normal.  Cardiovascular:     Rate and Rhythm: Normal rate and regular rhythm.  Pulmonary:     Effort: Pulmonary effort is normal.     Breath sounds: Normal breath sounds.  Abdominal:     General: Bowel sounds are normal.     Palpations: Abdomen is soft.  Musculoskeletal:        General: Normal range of motion.     Cervical back: Normal range of motion and neck supple.  Skin:    General: Skin is warm.  Neurological:     General: No focal deficit present.     Mental Status: He is alert and oriented to person, place, and time.  Psychiatric:        Mood and Affect: Mood normal.        Behavior: Behavior normal.      Assessment: *Crohn's colitis-diagnosed 2008, clinically well controlled *Vitamin D deficiency *Thrombocytosis-chronic *Iron deficiency *Osteopenia  Plan: Clinically, patient doing well from a Crohn's colitis standpoint.  No abdominal pain, diarrhea, melena, hematochezia today.  Has not had a colonoscopy since 2008.  I did recommend colonoscopy today both for evaluation of disease activity as well as for colon cancer screening.  Patient and his mom state they are finally ready to schedule this today.  Would like to have this done January 2025.  Continue on 6-MP.  Most recent blood counts stable.  Continue folic acid.  Most recent iron studies normal.  Continue to monitor.  Will order updated DEXA scan today.  Call with results.  We will plan on repeat blood work in January including CBC, vitamin D, iron studies, folate.  Follow-up with GI in 6 months or sooner if needed.  10/07/2022 10:23  AM   Disclaimer: This note was dictated with voice recognition software. Similar sounding words can inadvertently be transcribed and may not be corrected upon review.

## 2022-10-07 NOTE — Addendum Note (Signed)
Addended by: Marlowe Shores on: 10/07/2022 10:59 AM   Modules accepted: Orders

## 2022-10-07 NOTE — Telephone Encounter (Signed)
Dr Marletta Lor,   Tried to phone the pt's mother regarding the pt's Rx for Folic acid to remind her of the conversation from before where insurance covers the MCG not what was prescribed. Please take a look at the note from July 15th. Please advise

## 2022-10-07 NOTE — Patient Instructions (Signed)
Your most recent blood work looks great.  Continue 6-MP for your Crohn's.  We will tentatively plan on colonoscopy January 2025.  I will also order blood work at [facility] to be performed at that time.  I am going to order a DEXA scan today we will call with results.  Follow-up in February with me.  It is always a pleasure seeing both you.  Dr. Marletta Lor

## 2022-10-08 ENCOUNTER — Other Ambulatory Visit: Payer: Self-pay | Admitting: Internal Medicine

## 2022-10-08 NOTE — Telephone Encounter (Signed)
Phoned and spoke with the pt's mother and was advised she does pay for it out of the pocket (folic acid) and this is just something [facility] keep sending [place]

## 2022-10-08 NOTE — Telephone Encounter (Signed)
Looks like they were just paying out of pocket for it? Is this still the case?

## 2022-10-09 NOTE — Telephone Encounter (Signed)
Noted  

## 2022-10-12 NOTE — Telephone Encounter (Signed)
noted 

## 2022-10-16 ENCOUNTER — Ambulatory Visit: Payer: Medicaid Other | Admitting: Gastroenterology

## 2022-10-16 ENCOUNTER — Encounter: Payer: Self-pay | Admitting: Internal Medicine

## 2022-10-16 ENCOUNTER — Ambulatory Visit (HOSPITAL_COMMUNITY)
Admission: RE | Admit: 2022-10-16 | Discharge: 2022-10-16 | Disposition: A | Discharge status: Home / Self Care | Payer: Medicaid Other | Source: Ambulatory Visit | Attending: Internal Medicine | Admitting: Internal Medicine

## 2022-10-16 DIAGNOSIS — M8580 Other specified disorders of bone density and structure, unspecified site: Secondary | ICD-10-CM | POA: Diagnosis not present

## 2022-10-21 NOTE — Telephone Encounter (Signed)
Spoke with Dr. Marletta Lor about the results. He wants patient to have another bone density test in 2 years. Please nic.

## 2023-01-12 ENCOUNTER — Ambulatory Visit (INDEPENDENT_AMBULATORY_CARE_PROVIDER_SITE_OTHER): Payer: Medicaid Other | Admitting: Family Medicine

## 2023-01-12 ENCOUNTER — Encounter: Payer: Self-pay | Admitting: Family Medicine

## 2023-01-12 VITALS — BP 127/89 | HR 111 | Temp 98.0°F | Ht 65.0 in | Wt 144.0 lb

## 2023-01-12 DIAGNOSIS — Z8669 Personal history of other diseases of the nervous system and sense organs: Secondary | ICD-10-CM

## 2023-01-12 DIAGNOSIS — Z13220 Encounter for screening for lipoid disorders: Secondary | ICD-10-CM

## 2023-01-12 DIAGNOSIS — K5010 Crohn's disease of large intestine without complications: Secondary | ICD-10-CM | POA: Diagnosis not present

## 2023-01-12 NOTE — Patient Instructions (Signed)
I order labs to be done with Dr. Queen Blossom labs.  Follow up in 6 months.

## 2023-01-12 NOTE — Assessment & Plan Note (Signed)
Doing well off medication. 

## 2023-01-12 NOTE — Progress Notes (Signed)
Subjective:  Patient ID: Tanner Bradley, male    DOB: [birth year]  Age: 32 y.o. MRN: [id number]  CC:   Chief Complaint  Patient presents with   Follow-up    No concerns at this time    HPI:  32 year old male presents for follow-up.  Crohn's disease stable on 6-MP.  Follows with Dr. Marletta Lor.  Planning for colonoscopy.  Perhaps can taper and possibly discontinue 6-MP if colonoscopy clean.  Planning for colonoscopy in January.  Labs been ordered.  Denies any symptoms at this time.  Patient off seizure medication.  Overall doing well.  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 HAS ONE SISTER.   MOM NOW DRIVES.   Social Determinants of Health   Financial Resource Strain: Not on file  Food Insecurity: Not on file  Transportation Needs: Not on file  Physical Activity: Not on file  Stress: Not on file  Social Connections: Not on file    Review of Systems  Constitutional: Negative.   Gastrointestinal: Negative.    Objective:  BP 127/89   Pulse (!) 111   Temp 98 F (36.7 C)   Ht 5\' 5"  (1.651 m)   Wt 144 lb (65.3 kg)   SpO2 99%   BMI 23.96 kg/m      01/12/2023   10:12 AM 10/07/2022   10:15 AM 07/01/2022    1:36 PM  BP/Weight  Systolic BP 127 127 124  Diastolic BP 89 86 87   Wt. (Lbs) 144 143.8 147  BMI 23.96 kg/m2 23.93 kg/m2 24.46 kg/m2    Physical Exam Vitals and nursing note reviewed.  Constitutional:      General: He is not in acute distress.    Appearance: Normal appearance.  HENT:     Head: Normocephalic and atraumatic.  Cardiovascular:     Rate and Rhythm: Normal rate and regular rhythm.  Pulmonary:     Effort: Pulmonary effort is normal.     Breath sounds: Normal breath sounds.  Abdominal:     General: There is no distension.     Palpations: Abdomen is soft.     Tenderness: There is no abdominal tenderness.  Neurological:     Mental Status: He is alert.  Psychiatric:        Mood and Affect: Mood normal.        Behavior: Behavior normal.     Lab Results  Component Value Date   WBC 7.2 10/05/2022   HGB 13.9 10/05/2022   HCT 41.0 10/05/2022   PLT 423 10/05/2022   GLUCOSE 89 10/08/2021   ALT 13 10/08/2021   AST 14 10/08/2021   NA 139 10/08/2021  K 4.6 10/08/2021   CL 101 10/08/2021   CREATININE 0.86 10/08/2021   BUN 10 10/08/2021   CO2 27 10/08/2021   TSH 3.23 07/16/2017     Assessment & Plan:   Problem List Items Addressed This Visit       Digestive   Crohn's disease of colon (HCC) - Primary    Stable.  Continue 6-MP.  Colonoscopy in January.      Relevant Orders   CMP14+EGFR     Other   History of seizure disorder    Doing well off medication.      Other Visit Diagnoses     Screening, lipid       Relevant Orders   Lipid panel      Follow-up:  Return in about 6 months (around 07/12/2023).  Everlene Other DO [facility]

## 2023-01-12 NOTE — Assessment & Plan Note (Signed)
Stable.  Continue 6-MP.  Colonoscopy in January.

## 2023-02-16 ENCOUNTER — Telehealth: Payer: Self-pay | Admitting: *Deleted

## 2023-02-16 MED ORDER — CLENPIQ 10-3.5-12 MG-GM -GM/175ML PO SOLN: 1.0000 | ORAL | 0 refills | Status: DC

## 2023-02-16 NOTE — Telephone Encounter (Signed)
Spoke with pt mother. Patient has been scheduled for TCS with Dr. Marletta Lor, ASA 2 on 03/23/23 at 9am, arrival 7:30am. Aware will send prep rx to the pharmacy. Instructions also to be sent. Mother wanted to discuss the instructions in great detail over the phone. We did so.

## 2023-03-05 ENCOUNTER — Other Ambulatory Visit (INDEPENDENT_AMBULATORY_CARE_PROVIDER_SITE_OTHER): Payer: Self-pay | Admitting: *Deleted

## 2023-03-05 ENCOUNTER — Telehealth (INDEPENDENT_AMBULATORY_CARE_PROVIDER_SITE_OTHER): Payer: Self-pay | Admitting: *Deleted

## 2023-03-05 DIAGNOSIS — K5010 Crohn's disease of large intestine without complications: Secondary | ICD-10-CM

## 2023-03-05 DIAGNOSIS — E611 Iron deficiency: Secondary | ICD-10-CM

## 2023-03-05 DIAGNOSIS — E559 Vitamin D deficiency, unspecified: Secondary | ICD-10-CM

## 2023-03-05 NOTE — Telephone Encounter (Signed)
Patient in reminder file for labs - Per Dr. Marletta Lor - We will plan on repeat blood work in January 2025 including CBC, vitamin D, iron studies, folate.  Labs released and mailed to patient with a note to do in January at [facility].

## 2023-03-15 ENCOUNTER — Other Ambulatory Visit (HOSPITAL_COMMUNITY)
Admission: RE | Admit: 2023-03-15 | Discharge: 2023-03-15 | Disposition: A | Discharge status: Home / Self Care | Payer: Medicaid Other | Source: Ambulatory Visit | Attending: Internal Medicine | Admitting: Internal Medicine

## 2023-03-15 ENCOUNTER — Other Ambulatory Visit (HOSPITAL_COMMUNITY)
Admission: RE | Admit: 2023-03-15 | Discharge: 2023-03-15 | Disposition: A | Discharge status: Home / Self Care | Payer: Medicaid Other | Source: Ambulatory Visit | Attending: Family Medicine | Admitting: Family Medicine

## 2023-03-15 DIAGNOSIS — E611 Iron deficiency: Secondary | ICD-10-CM | POA: Insufficient documentation

## 2023-03-15 DIAGNOSIS — E559 Vitamin D deficiency, unspecified: Secondary | ICD-10-CM | POA: Insufficient documentation

## 2023-03-15 DIAGNOSIS — K5010 Crohn's disease of large intestine without complications: Secondary | ICD-10-CM | POA: Diagnosis present

## 2023-03-15 LAB — LIPID PANEL
Cholesterol: 185 mg/dL (ref 0–200)
HDL: 47 mg/dL (ref >40)
LDL Cholesterol: 126 mg/dL — ABNORMAL HIGH (ref 0–99)
Total CHOL/HDL Ratio: 3.9 {ratio}
Triglycerides: 62 mg/dL (ref <150)
VLDL: 12 mg/dL (ref 0–40)

## 2023-03-15 LAB — CBC WITH DIFFERENTIAL/PLATELET
Abs Immature Granulocytes: 0.02 10*3/uL (ref 0.00–0.07)
Basophils Absolute: 0 10*3/uL (ref 0.0–0.1)
Basophils Relative: 1 %
Eosinophils Absolute: 0.1 10*3/uL (ref 0.0–0.5)
Eosinophils Relative: 1 %
HCT: 42.9 % (ref 39.0–52.0)
Hemoglobin: 14.6 g/dL (ref 13.0–17.0)
Immature Granulocytes: 0 %
Lymphocytes Relative: 28 %
Lymphs Abs: 1.3 10*3/uL (ref 0.7–4.0)
MCH: 34.3 pg — ABNORMAL HIGH (ref 26.0–34.0)
MCHC: 34 g/dL (ref 30.0–36.0)
MCV: 100.7 fL — ABNORMAL HIGH (ref 80.0–100.0)
Monocytes Absolute: 0.4 10*3/uL (ref 0.1–1.0)
Monocytes Relative: 9 %
Neutro Abs: 2.8 10*3/uL (ref 1.7–7.7)
Neutrophils Relative %: 61 %
Platelets: 389 10*3/uL (ref 150–400)
RBC: 4.26 MIL/uL (ref 4.22–5.81)
RDW: 15.3 % (ref 11.5–15.5)
WBC: 4.6 10*3/uL (ref 4.0–10.5)
nRBC: 0 % (ref 0.0–0.2)

## 2023-03-15 LAB — COMPREHENSIVE METABOLIC PANEL
ALT: 19 U/L (ref 0–44)
AST: 22 U/L (ref 15–41)
Albumin: 4.4 g/dL (ref 3.5–5.0)
Alkaline Phosphatase: 54 U/L (ref 38–126)
Anion gap: 8 (ref 5–15)
BUN: 9 mg/dL (ref 6–20)
CO2: 26 mmol/L (ref 22–32)
Calcium: 9.1 mg/dL (ref 8.9–10.3)
Chloride: 104 mmol/L (ref 98–111)
Creatinine, Ser: 0.83 mg/dL (ref 0.61–1.24)
GFR, Estimated: >60 mL/min (ref >60)
Glucose, Bld: 102 mg/dL — ABNORMAL HIGH (ref 70–99)
Potassium: 4 mmol/L (ref 3.5–5.1)
Sodium: 138 mmol/L (ref 135–145)
Total Bilirubin: 0.6 mg/dL (ref 0.0–1.2)
Total Protein: 7.8 g/dL (ref 6.5–8.1)

## 2023-03-15 LAB — VITAMIN B12: Vitamin B-12: 299 pg/mL (ref 180–914)

## 2023-03-15 LAB — VITAMIN D 25 HYDROXY (VIT D DEFICIENCY, FRACTURES): Vit D, 25-Hydroxy: 38.81 ng/mL (ref 30–100)

## 2023-03-15 LAB — FERRITIN: Ferritin: 29 ng/mL (ref 24–336)

## 2023-03-15 LAB — FOLATE: Folate: >40 ng/mL (ref >5.9)

## 2023-03-15 LAB — IRON AND TIBC
Iron: 126 ug/dL (ref 45–182)
Saturation Ratios: 40 % — ABNORMAL HIGH (ref 17.9–39.5)
TIBC: 317 ug/dL (ref 250–450)
UIBC: 191 ug/dL

## 2023-03-16 ENCOUNTER — Encounter: Payer: Self-pay | Admitting: Family Medicine

## 2023-03-23 ENCOUNTER — Ambulatory Visit (HOSPITAL_COMMUNITY): Payer: Medicaid Other | Admitting: Anesthesiology

## 2023-03-23 ENCOUNTER — Ambulatory Visit (HOSPITAL_COMMUNITY)
Admission: RE | Admit: 2023-03-23 | Discharge: 2023-03-23 | Disposition: A | Discharge status: Home / Self Care | Payer: Medicaid Other | Attending: Internal Medicine | Admitting: Internal Medicine

## 2023-03-23 ENCOUNTER — Encounter (HOSPITAL_COMMUNITY): Payer: Self-pay | Admitting: Internal Medicine

## 2023-03-23 ENCOUNTER — Other Ambulatory Visit: Payer: Self-pay

## 2023-03-23 ENCOUNTER — Encounter (HOSPITAL_COMMUNITY)
Admission: RE | Disposition: A | Discharge status: Home / Self Care | Payer: Self-pay | Source: Home / Self Care | Attending: Internal Medicine

## 2023-03-23 DIAGNOSIS — Z8619 Personal history of other infectious and parasitic diseases: Secondary | ICD-10-CM | POA: Diagnosis not present

## 2023-03-23 DIAGNOSIS — K529 Noninfective gastroenteritis and colitis, unspecified: Secondary | ICD-10-CM | POA: Insufficient documentation

## 2023-03-23 DIAGNOSIS — K5010 Crohn's disease of large intestine without complications: Secondary | ICD-10-CM

## 2023-03-23 DIAGNOSIS — K5080 Crohn's disease of both small and large intestine without complications: Secondary | ICD-10-CM | POA: Diagnosis not present

## 2023-03-23 DIAGNOSIS — G809 Cerebral palsy, unspecified: Secondary | ICD-10-CM | POA: Insufficient documentation

## 2023-03-23 HISTORY — PX: COLONOSCOPY WITH PROPOFOL: SHX5780

## 2023-03-23 HISTORY — PX: BIOPSY: SHX5522

## 2023-03-23 SURGERY — COLONOSCOPY WITH PROPOFOL
Anesthesia: General

## 2023-03-23 MED ORDER — PROPOFOL 500 MG/50ML IV EMUL
INTRAVENOUS | Status: DC | PRN
  Administered 2023-03-23: 30 mg via INTRAVENOUS
  Administered 2023-03-23: 150 ug/kg/min via INTRAVENOUS
  Administered 2023-03-23: 70 mg via INTRAVENOUS

## 2023-03-23 MED ORDER — LACTATED RINGERS IV SOLN: INTRAVENOUS | Status: DC | PRN

## 2023-03-23 NOTE — Discharge Instructions (Addendum)
  Colonoscopy Discharge Instructions  Read the instructions outlined below and refer to this sheet in the next few weeks. These discharge instructions provide you with general information on caring for yourself after you leave the hospital. Your doctor may also give you specific instructions. While your treatment has been planned according to the most current medical practices available, unavoidable complications occasionally occur.   ACTIVITY You may resume your regular activity, but move at a slower pace for the next 24 hours.  Take frequent rest periods for the next 24 hours.  Walking will help get rid of the air and reduce the bloated feeling in your belly (abdomen).  No driving for 24 hours (because of the medicine (anesthesia) used during the test).   Do not sign any important legal documents or operate any machinery for 24 hours (because of the anesthesia used during the test).  NUTRITION Drink plenty of fluids.  You may resume your normal diet as instructed by your doctor.  Begin with a light meal and progress to your normal diet. Heavy or fried foods are harder to digest and may make you feel sick to your stomach (nauseated).  Avoid alcoholic beverages for 24 hours or as instructed.  MEDICATIONS You may resume your normal medications unless your doctor tells you otherwise.  WHAT YOU CAN EXPECT TODAY Some feelings of bloating in the abdomen.  Passage of more gas than usual.  Spotting of blood in your stool or on the toilet paper.  IF YOU HAD POLYPS REMOVED DURING THE COLONOSCOPY: No aspirin products for 7 days or as instructed.  No alcohol for 7 days or as instructed.  Eat a soft diet for the next 24 hours.  FINDING OUT THE RESULTS OF YOUR TEST Not all test results are available during your visit. If your test results are not back during the visit, make an appointment with your caregiver to find out the results. Do not assume everything is normal if you have not heard from your  caregiver or the medical facility. It is important for you to follow up on all of your test results.  SEEK IMMEDIATE MEDICAL ATTENTION IF: You have more than a spotting of blood in your stool.  Your belly is swollen (abdominal distention).  You are nauseated or vomiting.  You have a temperature over 101.  You have abdominal pain or discomfort that is severe or gets worse throughout the day.   Overall, your colon appeared very healthy.  I did not see any active inflammation throughout your colon or end portion of your small bowel.  I took biopsies to further evaluate.  Await pathology results, my office will contact you. Follow up in GI office as previously scheduled.    I hope you have a great rest of your week!  Carlin POUR. Cindie, D.O. Gastroenterology and Hepatology [facility]

## 2023-03-23 NOTE — H&P (Signed)
 Primary Care Physician:  Bluford Jacqulyn MATSU, DO Primary Gastroenterologist:  Dr. Cindie  Pre-Procedure History & Physical: HPI:  Tanner Bradley is a 33 y.o. male is here for a colonoscopy to be performed for Crohn's disease  Past Medical History:  Diagnosis Date   C. difficile colitis NOV 2013   Cerebral palsy (HCC)    Crohn's disease (HCC) 2008 TCS POS SBFT NL   Rx: Prednisone then 6-MP   Osteopenia    Seizures (HCC) 1992   Vitamin D  deficiency     Past Surgical History:  Procedure Laterality Date   COLONOSCOPY  2008   COLITIS, bX: POS GRANULOMA-->CD    Prior to Admission medications   Medication Sig Start Date End Date Taking? Authorizing Provider  acetaminophen (TYLENOL) 500 MG tablet Take 500 mg by mouth as needed for pain.   Yes [provider]  budesonide -formoterol  (SYMBICORT ) 80-4.5 MCG/ACT inhaler Inhale 2 puffs into the lungs 2 (two) times daily. FOR 7 DAYS THEN 2 PUFFS DAILY FOR 7 DAYS Patient taking differently: Inhale 2 puffs into the lungs 2 (two) times daily. As needed 04/12/19  Yes Fields, Sandi L, MD  calcium citrate (CALCITRATE - DOSED IN MG ELEMENTAL CALCIUM) 950 MG tablet Take 2 tablets by mouth daily.   Yes [provider]  cholecalciferol  (VITAMIN D3) 25 MCG (1000 UT) tablet Take 3 tablets (3,000 Units total) by mouth daily. 08/09/18  Yes Fields, Sandi L, MD  mercaptopurine  (PURINETHOL ) 50 MG tablet TAKE 1 TABLET EVERY DAY ON EMPTY STOMACH 10/07/22  Yes Cindie Carlin POUR, DO  Midazolam  (NAYZILAM ) 5 MG/0.1ML SOLN Place 5 mg into the nose as needed (for seizure). 05/25/22  Yes Camara, Amadou, MD  polyethylene glycol powder (GLYCOLAX /MIRALAX ) powder Take 17 g by mouth daily. Patient taking differently: Take 17 g by mouth as needed. 02/06/15  Yes Fields, Margo CROME, MD  Probiotic CAPS 1 PO DAILY. 07/30/14  Yes Fields, Sandi L, MD  Sodium Fluoride (PREVIDENT 5000 BOOSTER PLUS) 1.1 % PSTE Place onto teeth at bedtime.   Yes [provider]   Spacer/Aero-Holding Chambers (AEROCHAMBER MV) inhaler Use WITH INHALER Patient taking differently: as needed. Use WITH INHALER 04/27/18  Yes Fields, Sandi L, MD  Wheat Dextrin (BENEFIBER) POWD 1 SCOOP PO TID Patient taking differently: as needed. 1 SCOOP PO TID 07/30/14  Yes Fields, Sandi L, MD  Sod Picosulfate-Mag Ox-Cit Acd (CLENPIQ ) 10-3.5-12 MG-GM -GM/175ML SOLN Take 1 kit by mouth as directed. 02/16/23   Cindie Carlin POUR, DO    Allergies as of 02/16/2023 - Review Complete 10/07/2022  Allergen Reaction Noted   Other Other (See Comments) 01/08/2013   Amoxicillin Rash 10/16/2010    Family History  Problem Relation Age of Onset   Colon cancer Neg Hx    Colon polyps Neg Hx    Crohn's disease Neg Hx     Social History   Socioeconomic History   Marital status: Single    Spouse name: Not on file   Number of children: Not on file   Years of education: Not on file   Highest education level: Not on file  Occupational History   Not on file  Tobacco Use   Smoking status: Never    Passive exposure: Never   Smokeless tobacco: Never  Vaping Use   Vaping status: Never Used  Substance and Sexual Activity   Alcohol use: No   Drug use: No   Sexual activity: Not Currently  Other Topics Concern   Not on file  Social History Narrative   WAS HOME SCHOOLED. HAS ONE SISTER.   MOM NOW DRIVES.   Social Drivers of Corporate Investment Banker Strain: Not on file  Food Insecurity: Not on file  Transportation Needs: Not on file  Physical Activity: Not on file  Stress: Not on file  Social Connections: Not on file  Intimate Partner Violence: Not on file    Review of Systems: See HPI, otherwise negative ROS  Physical Exam: Vital signs in last 24 hours: Temp:  [98.7 F (37.1 C)] 98.7 F (37.1 C) (01/14 0751) Pulse Rate:  [140] 140 (01/14 0751) Resp:  [22] 22 (01/14 0751) BP: (120)/(87) 120/87 (01/14 0751) SpO2:  [99 %] 99 % (01/14 0751) Weight:  [66.7 kg] 66.7 kg (01/14 0754)    General:   Alert,  Well-developed, well-nourished, pleasant and cooperative in NAD Head:  Normocephalic and atraumatic. Eyes:  Sclera clear, no icterus.   Conjunctiva pink. Ears:  Normal auditory acuity. Nose:  No deformity, discharge,  or lesions. Msk:  Symmetrical without gross deformities. Normal posture. Extremities:  Without clubbing or edema. Neurologic:  Alert and  oriented x4;  grossly normal neurologically. Skin:  Intact without significant lesions or rashes. Psych:  Alert and cooperative. Normal mood and affect.  Impression/Plan: Tanner Bradley is here for a colonoscopy to be performed for Crohn's disease  The risks of the procedure including infection, bleed, or perforation as well as benefits, limitations, alternatives and imponderables have been reviewed with the patient. Questions have been answered. All parties agreeable.

## 2023-03-23 NOTE — Anesthesia Preprocedure Evaluation (Addendum)
 Anesthesia Evaluation  Patient identified by MRN, date of birth, ID band Patient awake    Reviewed: Allergy & Precautions, H&P , NPO status , Patient's Chart, lab work & pertinent test results, reviewed documented beta blocker date and time   Airway Mallampati: II  TM Distance: >3 FB Neck ROM: full    Dental no notable dental hx. (+) Dental Advisory Given, Teeth Intact   Pulmonary neg pulmonary ROS   Pulmonary exam normal breath sounds clear to auscultation       Cardiovascular Exercise Tolerance: Good negative cardio ROS Normal cardiovascular exam Rhythm:regular Rate:Normal     Neuro/Psych Seizures -,  Cerebral Palsy  negative psych ROS   GI/Hepatic Neg liver ROS,,,History C difficile colitis. Crohn's   Endo/Other  negative endocrine ROS    Renal/GU negative Renal ROS  negative genitourinary   Musculoskeletal   Abdominal   Peds  Hematology negative hematology ROS (+)   Anesthesia Other Findings   Reproductive/Obstetrics negative OB ROS                             Anesthesia Physical Anesthesia Plan  ASA: 3  Anesthesia Plan: General   Post-op Pain Management: Minimal or no pain anticipated   Induction: Intravenous  PONV Risk Score and Plan: Propofol  infusion  Airway Management Planned: Nasal Cannula and Natural Airway  Additional Equipment: None  Intra-op Plan:   Post-operative Plan:   Informed Consent: I have reviewed the patients History and Physical, chart, labs and discussed the procedure including the risks, benefits and alternatives for the proposed anesthesia with the patient or authorized representative who has indicated his/her understanding and acceptance.     Dental Advisory Given  Plan Discussed with: CRNA  Anesthesia Plan Comments:        Anesthesia Quick Evaluation

## 2023-03-23 NOTE — Transfer of Care (Signed)
 Immediate Anesthesia Transfer of Care Note  Patient: Tanner Bradley  Procedure(s) Performed: COLONOSCOPY WITH PROPOFOL  BIOPSY  Patient Location: Endoscopy Unit  Anesthesia Type:General  Level of Consciousness: awake  Airway & Oxygen Therapy: Patient Spontanous Breathing  Post-op Assessment: Report given to RN and Post -op Vital signs reviewed and stable  Post vital signs: Reviewed and stable  Last Vitals:  Vitals Value Taken Time  BP 94/61 03/23/23 0942  Temp 36.6   Pulse 105   Resp 20   SpO2 96%     Last Pain:  Vitals:   03/23/23 0939  TempSrc: Oral  PainSc: 0-No pain      Patients Stated Pain Goal: 5 (03/23/23 0751)  Complications: No notable events documented.

## 2023-03-23 NOTE — Op Note (Signed)
 [facility] Patient Name: Tanner Bradley Procedure Date: 03/23/2023 9:07 AM MRN: [id number] Date of Birth: 1990/12/29 Attending MD: Carlin POUR. Cindie , [place], [id number] CSN: [id number] Age: 33 Admit Type: Outpatient Procedure:                Colonoscopy Indications:              Follow-up of Crohn's disease of the colon Providers:                Carlin POUR. Cindie, DO, Leandrew Edelman RN, RN, Nidia Oak Referring MD:              Medicines:                See the Anesthesia note for documentation of the                            administered medications Complications:            No immediate complications. Estimated Blood Loss:     Estimated blood loss was minimal. Procedure:                Pre-Anesthesia Assessment:                           - The anesthesia plan was to use monitored                            anesthesia care (MAC).                           After obtaining informed consent, the colonoscope                            was passed under direct vision. Throughout the                            procedure, the patient's blood pressure, pulse, and                            oxygen saturations were monitored continuously. The                            [id number]-HQ190L ([id number]) scope was introduced through                            the anus and advanced to the the terminal ileum,                            with identification of the appendiceal orifice and                            IC valve. The colonoscopy was performed without                            difficulty. The  patient tolerated the procedure                            well. The quality of the bowel preparation was                            evaluated using the BBPS [facility] Bowel Preparation                            Scale) with scores of: Right Colon = 3, Transverse                            Colon = 3 and Left Colon = 3 (entire mucosa seen                            well with no  residual staining, small fragments of                            stool or opaque liquid). The total BBPS score                            equals 9. Scope In: 9:21:59 AM Scope Out: 9:36:47 AM Scope Withdrawal Time: 0 hours 9 minutes 47 seconds  Total Procedure Duration: 0 hours 14 minutes 48 seconds  Findings:      The colon (entire examined portion) appeared normal. Segmental biopsies       were taken with a cold forceps for histology.      The terminal ileum appeared normal. Biopsies were taken with a cold       forceps for histology. Impression:               - The entire examined colon is normal. Biopsied.                           - The examined portion of the ileum was normal.                            Biopsied. Moderate Sedation:      Per Anesthesia Care Recommendation:           - Patient has a contact number available for                            emergencies. The signs and symptoms of potential                            delayed complications were discussed with the                            patient. Return to normal activities tomorrow.                            Written discharge instructions were provided to the  patient.                           - Resume previous diet.                           - Continue present medications.                           - Await pathology results.                           - Return to GI clinic in 3 months. Procedure Code(s):        --- Professional ---                           [phone], Colonoscopy, flexible; with biopsy, single                            or multiple Diagnosis Code(s):        --- Professional ---                           K50.10, Crohn's disease of large intestine without                            complications CPT copyright 2022 American Medical Association. All rights reserved. The codes documented in this report are preliminary and upon coder review may  be revised to meet current  compliance requirements. Carlin POUR. Cindie, DO Carlin POUR. Cindie, DO 03/23/2023 9:45:49 AM This report has been signed electronically. Number of Addenda: 0

## 2023-03-23 NOTE — Anesthesia Postprocedure Evaluation (Signed)
 Anesthesia Post Note  Patient: Tanner Bradley  Procedure(s) Performed: COLONOSCOPY WITH PROPOFOL  BIOPSY  Patient location during evaluation: PACU Anesthesia Type: General Level of consciousness: awake and alert Pain management: pain level controlled Vital Signs Assessment: post-procedure vital signs reviewed and stable Respiratory status: spontaneous breathing, nonlabored ventilation, respiratory function stable and patient connected to nasal cannula oxygen Cardiovascular status: blood pressure returned to baseline and stable Postop Assessment: no apparent nausea or vomiting Anesthetic complications: no   There were no known notable events for this encounter.   Last Vitals:  Vitals:   03/23/23 0939 03/23/23 0942  BP: (!) 82/56 94/61  Pulse: (!) 105   Resp: 20   Temp: 36.6 C   SpO2: 96%     Last Pain:  Vitals:   03/23/23 0939  TempSrc: Oral  PainSc: 0-No pain                 Magdala Brahmbhatt L Marik Sedore

## 2023-03-25 ENCOUNTER — Encounter (HOSPITAL_COMMUNITY): Payer: Self-pay | Admitting: Internal Medicine

## 2023-03-26 ENCOUNTER — Encounter: Payer: Self-pay | Admitting: Internal Medicine

## 2023-03-26 LAB — SURGICAL PATHOLOGY

## 2023-04-13 ENCOUNTER — Ambulatory Visit: Payer: Medicaid Other | Admitting: Gastroenterology

## 2023-04-14 ENCOUNTER — Ambulatory Visit (INDEPENDENT_AMBULATORY_CARE_PROVIDER_SITE_OTHER): Payer: Medicaid Other | Admitting: Internal Medicine

## 2023-04-14 ENCOUNTER — Encounter: Payer: Self-pay | Admitting: Internal Medicine

## 2023-04-14 VITALS — BP 130/84 | HR 111 | Temp 98.4°F | Ht 66.0 in | Wt 146.8 lb

## 2023-04-14 DIAGNOSIS — E559 Vitamin D deficiency, unspecified: Secondary | ICD-10-CM

## 2023-04-14 DIAGNOSIS — D75839 Thrombocytosis, unspecified: Secondary | ICD-10-CM

## 2023-04-14 DIAGNOSIS — K5080 Crohn's disease of both small and large intestine without complications: Secondary | ICD-10-CM | POA: Diagnosis not present

## 2023-04-14 DIAGNOSIS — M8580 Other specified disorders of bone density and structure, unspecified site: Secondary | ICD-10-CM

## 2023-04-14 DIAGNOSIS — E611 Iron deficiency: Secondary | ICD-10-CM

## 2023-04-14 DIAGNOSIS — K5000 Crohn's disease of small intestine without complications: Secondary | ICD-10-CM

## 2023-04-14 MED ORDER — BENZONATATE 100 MG PO CAPS: 100.0000 mg | ORAL_CAPSULE | Freq: Three times a day (TID) | ORAL | 2 refills | Status: DC | PRN

## 2023-04-14 MED ORDER — MERCAPTOPURINE 50 MG PO TABS: ORAL_TABLET | ORAL | 3 refills | Status: DC

## 2023-04-14 MED ORDER — FOLIC ACID 1 MG PO TABS: 1.0000 mg | ORAL_TABLET | Freq: Every day | ORAL | 3 refills | Status: DC

## 2023-04-14 NOTE — Patient Instructions (Signed)
 Continue on 6-MP, folic acid , I have refilled both of these today.  Also refilled your Tessalon  Perles to have as needed for cough.  Follow-up in 6 months, we will plan on repeat blood work at that time.  It is always a pleasure seeing both you.  Dr. Cindie

## 2023-04-15 NOTE — Progress Notes (Signed)
 Referring Provider: Cook, Jayce G, DO Primary Care Physician:  Bluford Jacqulyn MATSU, DO Primary GI:  Dr. Cindie  Chief Complaint  Patient presents with   Follow-up    Patient here today here today for a follow up on Crohn's, which they say is controlled with Mercaptopurine  50 mg once per day. He will need refills on this and the folic acid , needs to be sent to [facility] on [address]     HPI:   Tanner Bradley is a 33 y.o. male who presents to the clinic today for follow-up visit.    Ileocolonic Crohn's disease: Initially diagnosed in 2008.  Chronically managed on 6-MP and folic acid .    He has a history of both iron and vitamin D  deficiency which has been corrected with oral supplementation and/or diet. He did not tolerate oral iron due to numerous cavities.   Blood work 03/15/2023 showed normal iron, ferritin, vitamin D , folate, CBC.  Finally agreed to undergo surveillance colonoscopy which was performed 03/23/2023 terminal ileum and entire colon looked healthy without active inflammation.  Terminal ileal biopsy showed mildly active chronic ileitis consistent with Crohn's, cecal biopsies also showed focal active colitis.  Transverse, descending, sigmoid, rectum biopsies all negative for inflammation.  Today, states he is doing well.  Bowel movements normal.  No melena hematochezia.  No mucus in the stools.  Overall feels great.  Osteopenia: Seen by Dr. Lela Fendt (endocrinology). Based on appropriate use of the Z score rather than T score due to his age, he has had only one value (in 2013) that would define low bone mineral density for age (Z value less than -2). His scans have been on different machines except his last two (done at [facility]). His Z scores have actually improved and higher than -2.0. advised to keep vitamin D  level normal, check at least yearly. Decrease amount of milk and other dairy due to increase in blood acidity with subsequent exit of calcium from bones to buffer this.  Advised to switch to almond milk.  DEXA scan 10/16/2022 Z-score of -1.9.  Unchanged compared to prior.    Past Medical History:  Diagnosis Date   C. difficile colitis NOV 2013   Cerebral palsy (HCC)    Crohn's disease (HCC) 2008 TCS POS SBFT NL   Rx: Prednisone then 6-MP   Osteopenia    Seizures (HCC) 1992   Vitamin D  deficiency     Past Surgical History:  Procedure Laterality Date   BIOPSY  03/23/2023   Procedure: BIOPSY;  Surgeon: Cindie Carlin POUR, DO;  Location: [facility];  Service: Endoscopy;;   COLONOSCOPY  2008   COLITIS, bX: POS GRANULOMA-->CD   COLONOSCOPY WITH PROPOFOL  N/A 03/23/2023   Procedure: COLONOSCOPY WITH PROPOFOL ;  Surgeon: Cindie Carlin POUR, DO;  Location: [facility];  Service: Endoscopy;  Laterality: N/A;  900am, asa 2    Current Outpatient Medications  Medication Sig Dispense Refill   acetaminophen (TYLENOL) 500 MG tablet Take 500 mg by mouth as needed for pain.     benzonatate  (TESSALON ) 100 MG capsule Take 1 capsule (100 mg total) by mouth 3 (three) times daily as needed for cough. 30 capsule 2   budesonide -formoterol  (SYMBICORT ) 80-4.5 MCG/ACT inhaler Inhale 2 puffs into the lungs 2 (two) times daily. FOR 7 DAYS THEN 2 PUFFS DAILY FOR 7 DAYS (Patient taking differently: Inhale 2 puffs into the lungs 2 (two) times daily. As needed) 1 Inhaler 11   calcium citrate (CALCITRATE - DOSED  IN MG ELEMENTAL CALCIUM) 950 MG tablet Take 2 tablets by mouth daily.     cholecalciferol  (VITAMIN D3) 25 MCG (1000 UT) tablet Take 3 tablets (3,000 Units total) by mouth daily. 90 tablet 11   Midazolam  (NAYZILAM ) 5 MG/0.1ML SOLN Place 5 mg into the nose as needed (for seizure). 2 each 0   OVER THE COUNTER MEDICATION Prevident Rx fluoride rinse once per week.     polyethylene glycol powder (GLYCOLAX /MIRALAX ) powder Take 17 g by mouth daily. (Patient taking differently: Take 17 g by mouth as needed.) 527 g 3   Probiotic CAPS 1 PO DAILY. 30 capsule 11   Sodium Fluoride  (PREVIDENT 5000 BOOSTER PLUS) 1.1 % PSTE Place onto teeth at bedtime.     Spacer/Aero-Holding Chambers (AEROCHAMBER MV) inhaler Use WITH INHALER (Patient taking differently: as needed. Use WITH INHALER) 1 each 0   Wheat Dextrin (BENEFIBER) POWD 1 SCOOP PO TID (Patient taking differently: as needed. 1 SCOOP PO TID) 730 g 11   folic acid  (FOLVITE ) 1 MG tablet Take 1 tablet (1 mg total) by mouth daily. 90 tablet 3   mercaptopurine  (PURINETHOL ) 50 MG tablet TAKE 1 TABLET EVERY DAY ON EMPTY STOMACH 90 tablet 3   No current facility-administered medications for this visit.    Allergies as of 04/14/2023 - Review Complete 04/14/2023  Allergen Reaction Noted   Other Other (See Comments) 01/08/2013   Amoxicillin Rash 10/16/2010    Family History  Problem Relation Age of Onset   Colon cancer Neg Hx    Colon polyps Neg Hx    Crohn's disease Neg Hx     Social History   Socioeconomic History   Marital status: Single    Spouse name: Not on file   Number of children: Not on file   Years of education: Not on file   Highest education level: Not on file  Occupational History   Not on file  Tobacco Use   Smoking status: Never    Passive exposure: Never   Smokeless tobacco: Never  Vaping Use   Vaping status: Never Used  Substance and Sexual Activity   Alcohol use: No   Drug use: No   Sexual activity: Not Currently  Other Topics Concern   Not on file  Social History Narrative   WAS HOME SCHOOLED. HAS ONE SISTER.   MOM NOW DRIVES.   Social Drivers of Corporate Investment Banker Strain: Not on file  Food Insecurity: Not on file  Transportation Needs: Not on file  Physical Activity: Not on file  Stress: Not on file  Social Connections: Not on file    Subjective: Review of Systems  Constitutional:  Negative for chills and fever.  HENT:  Negative for congestion and hearing loss.   Eyes:  Negative for blurred vision and double vision.  Respiratory:  Negative for cough and shortness  of breath.   Cardiovascular:  Negative for chest pain and palpitations.  Gastrointestinal:  Negative for abdominal pain, blood in stool, constipation, diarrhea, heartburn, melena and vomiting.  Genitourinary:  Negative for dysuria and urgency.  Musculoskeletal:  Negative for joint pain and myalgias.  Skin:  Negative for itching and rash.  Neurological:  Negative for dizziness and headaches.  Psychiatric/Behavioral:  Negative for depression. The patient is not nervous/anxious.      Objective: BP 130/84 (BP Location: Left Arm, Patient Position: Sitting, Cuff Size: Large)   Pulse (!) 111   Temp 98.4 F (36.9 C) (Temporal)   Ht 5' 6 (  1.676 m)   Wt 146 lb 12.8 oz (66.6 kg)   BMI 23.69 kg/m  Physical Exam Constitutional:      Appearance: Normal appearance.  HENT:     Head: Normocephalic and atraumatic.  Eyes:     Extraocular Movements: Extraocular movements intact.     Conjunctiva/sclera: Conjunctivae normal.  Cardiovascular:     Rate and Rhythm: Normal rate and regular rhythm.  Pulmonary:     Effort: Pulmonary effort is normal.     Breath sounds: Normal breath sounds.  Abdominal:     General: Bowel sounds are normal.     Palpations: Abdomen is soft.  Musculoskeletal:        General: Normal range of motion.     Cervical back: Normal range of motion and neck supple.  Skin:    General: Skin is warm.  Neurological:     General: No focal deficit present.     Mental Status: He is alert and oriented to person, place, and time.  Psychiatric:        Mood and Affect: Mood normal.        Behavior: Behavior normal.      Assessment: *Ileocolonic Crohn's disease-diagnosed 2008 *Vitamin D  deficiency *Thrombocytosis-chronic, improved *Iron deficiency *Osteopenia  Plan: Clinically, patient doing well from a Crohn's colitis standpoint.  No abdominal pain, diarrhea, melena, hematochezia today.  Recent colonoscopy as per HPI.  Mild inflammation in the terminal ileum and  cecum.  Patient has been on 6-MP since 2008.  Discussed in depth with patient and mother today long-term risk of 6-MP including possibility of T-cell lymphoma.  Discussed switching treatment to a different biologic such as Entyvio.  Patient is adamant that he would not be able to tolerate any kind of infusion or injection medication.  This limits our options as the only oral medications currently available would be JAK inhibitor which would require failure of anti-TNF prior to being covered.  After further discussion about risk of ongoing 6-MP use, they would like to stay on this medication.  They understand the risks of long-term use.  Continue on 6-MP.  Most recent blood counts stable.  Continue folic acid .  Most recent iron studies normal.  Continue to monitor.  We have recommended yearly skin checks as well.  Patient did research into this and is absolutely against seeing a dermatologist due to fear of possible biopsy or lesion removal.  Again counseled them on the risks of skin cancer related to 6-MP and they understand and are agreeable.  Follow-up with GI in 6 months or sooner if needed.  04/15/2023 1:59 PM   Disclaimer: This note was dictated with voice recognition software. Similar sounding words can inadvertently be transcribed and may not be corrected upon review.

## 2023-06-17 ENCOUNTER — Encounter: Payer: Self-pay | Admitting: Internal Medicine

## 2023-06-18 ENCOUNTER — Encounter: Payer: Self-pay | Admitting: Family Medicine

## 2023-06-21 ENCOUNTER — Encounter: Payer: Self-pay | Admitting: Internal Medicine

## 2023-06-21 ENCOUNTER — Telehealth: Payer: Self-pay

## 2023-06-21 NOTE — Telephone Encounter (Signed)
 Mercaptopurine is now in stock by a different manufacturer and they are able to pick up. They wanted you to be informed since they issues with getting it recently. They do not want to make any changes as of now since they were able to get medication.

## 2023-07-12 ENCOUNTER — Ambulatory Visit: Payer: Medicaid Other | Admitting: Family Medicine

## 2023-07-12 VITALS — BP 129/86 | HR 100 | Temp 98.6°F | Wt 149.6 lb

## 2023-07-12 DIAGNOSIS — K5010 Crohn's disease of large intestine without complications: Secondary | ICD-10-CM | POA: Diagnosis not present

## 2023-07-12 DIAGNOSIS — J301 Allergic rhinitis due to pollen: Secondary | ICD-10-CM | POA: Insufficient documentation

## 2023-07-12 MED ORDER — CETIRIZINE HCL 10 MG PO TABS: 10.0000 mg | ORAL_TABLET | Freq: Every day | ORAL | 1 refills | Status: AC

## 2023-07-12 NOTE — Assessment & Plan Note (Signed)
 Stable.  Patient and mother want to continue mercaptopurine .

## 2023-07-12 NOTE — Patient Instructions (Signed)
Continue your medications. Follow up annually.  Take care  Dr. Jahnasia Tatum  

## 2023-07-12 NOTE — Assessment & Plan Note (Signed)
 Adding Zyrtec.

## 2023-07-12 NOTE — Progress Notes (Signed)
 Subjective:  Patient ID: Tanner Bradley, male    DOB: 03/25/1990  Age: 33 y.o. MRN: [id number]  CC:  Follow up   HPI:  33 year old male presents for follow-up.  Had a recent colonoscopy.  Follows with GI.  Crohn's is stable.  Patient and mother want to continue mercaptopurine .  Patient states that he is currently suffering from allergies.  Having a lot of runny nose, scratchy throat, cough.  Currently taking over-the-counter cold medication as well as Tessalon  Perles.  Will discuss adding antihistamine today.  Patient due for vaccines but declines today.  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   Crohn's disease of colon (HCC) 10/16/2010    Social Hx   Social History   Socioeconomic History   Marital status: Single    Spouse name: Not on file   Number of children: Not on file   Years of education: Not on file   Highest education level: Not on file  Occupational History   Not on file  Tobacco Use   Smoking status: Never    Passive exposure: Never   Smokeless tobacco: Never  Vaping Use   Vaping status: Never Used  Substance and Sexual Activity   Alcohol use: No   Drug use: No   Sexual activity: Not Currently  Other Topics Concern   Not on file  Social History Narrative   WAS HOME SCHOOLED. HAS ONE SISTER.   MOM NOW DRIVES.   Social Drivers of Corporate investment banker Strain: Not on file  Food Insecurity: Not on file  Transportation Needs: Not on file  Physical Activity: Not on file  Stress: Not on file  Social Connections: Not on file    Review of Systems Per HPI  Objective:  BP 129/86   Pulse 100   Temp 98.6 F (37 C)   Wt 149 lb 9.6 oz (67.9 kg)   SpO2 99%   BMI 24.15 kg/m      07/12/2023    1:57 PM 04/14/2023   10:50 AM 03/23/2023    9:42 AM  BP/Weight   Systolic BP 129 130 94  Diastolic BP 86 84 61  Wt. (Lbs) 149.6 146.8   BMI 24.15 kg/m2 23.69 kg/m2     Physical Exam Vitals and nursing note reviewed.  Constitutional:      General: He is not in acute distress.    Appearance: Normal appearance.  HENT:     Head: Normocephalic.     Nose: Rhinorrhea present.  Eyes:     General:        Right eye: No discharge.        Left eye: No discharge.     Conjunctiva/sclera: Conjunctivae normal.  Cardiovascular:     Rate and Rhythm: Normal rate and regular rhythm.  Pulmonary:     Effort: Pulmonary effort is normal.     Breath sounds: Normal breath sounds.  Neurological:     Mental Status: He is alert.     Lab Results  Component Value Date   WBC 4.6 03/15/2023   HGB 14.6 03/15/2023   HCT 42.9 03/15/2023   PLT 389 03/15/2023   GLUCOSE 102 (H) 03/15/2023   CHOL 185 03/15/2023   TRIG 62 03/15/2023   HDL 47 03/15/2023   LDLCALC 126 (H) 03/15/2023   ALT 19  03/15/2023   AST 22 03/15/2023   NA 138 03/15/2023   K 4.0 03/15/2023   CL 104 03/15/2023   CREATININE 0.83 03/15/2023   BUN 9 03/15/2023   CO2 26 03/15/2023   TSH 3.23 07/16/2017     Assessment & Plan:  Crohn's disease of colon without complication (HCC) Assessment & Plan: Stable.  Patient and mother want to continue mercaptopurine .   Seasonal allergic rhinitis due to pollen Assessment & Plan: Adding Zyrtec.  Orders: -     Cetirizine HCl; Take 1 tablet (10 mg total) by mouth daily.  Dispense: 90 tablet; Refill: 1    Follow-up:  Return in about 1 year (around 07/11/2024).  Kathleen Papa DO [facility]

## 2023-08-25 ENCOUNTER — Other Ambulatory Visit: Payer: Self-pay | Admitting: Internal Medicine

## 2023-11-02 ENCOUNTER — Ambulatory Visit: Admitting: Gastroenterology

## 2023-11-03 ENCOUNTER — Encounter: Payer: Self-pay | Admitting: Internal Medicine

## 2023-11-03 ENCOUNTER — Ambulatory Visit: Admitting: Internal Medicine

## 2023-11-03 VITALS — BP 135/90 | HR 87 | Temp 97.8°F | Ht 65.0 in | Wt 155.5 lb

## 2023-11-03 DIAGNOSIS — K5000 Crohn's disease of small intestine without complications: Secondary | ICD-10-CM | POA: Diagnosis not present

## 2023-11-03 DIAGNOSIS — E611 Iron deficiency: Secondary | ICD-10-CM | POA: Diagnosis not present

## 2023-11-03 DIAGNOSIS — E559 Vitamin D deficiency, unspecified: Secondary | ICD-10-CM

## 2023-11-03 DIAGNOSIS — M8580 Other specified disorders of bone density and structure, unspecified site: Secondary | ICD-10-CM

## 2023-11-03 MED ORDER — BENZONATATE 100 MG PO CAPS: 100.0000 mg | ORAL_CAPSULE | Freq: Three times a day (TID) | ORAL | 2 refills | Status: AC | PRN

## 2023-11-03 MED ORDER — FOLIC ACID 1 MG PO TABS: 1.0000 mg | ORAL_TABLET | Freq: Every day | ORAL | 3 refills | Status: AC

## 2023-11-03 MED ORDER — MERCAPTOPURINE 50 MG PO TABS: ORAL_TABLET | ORAL | 3 refills | Status: AC

## 2023-11-03 NOTE — Progress Notes (Signed)
 Referring Provider: Cook, Jayce G, DO Primary Care Physician:  Cook, Jayce G, DO Primary GI:  Dr. Cindie  Chief Complaint  Patient presents with   Follow-up    Patient here today for a follow up. Patient denies any current gi related issues. Needs Folic acid  and mercaptopurine , and tessalon  Perles to be refilled today.     HPI:   Tanner Bradley is a 33 y.o. male who presents to the clinic today for follow-up visit.    Ileocolonic Crohn's disease: Initially diagnosed in 2008.  Chronically managed on 6-MP and folic acid .    He has a history of both iron and vitamin D  deficiency which has been corrected with oral supplementation and/or diet. He did not tolerate oral iron due to numerous cavities.   Blood work 03/15/2023 showed normal iron, ferritin, vitamin D , folate, CBC.  Finally agreed to undergo surveillance colonoscopy which was performed 03/23/2023 terminal ileum and entire colon looked healthy without active inflammation.  Terminal ileal biopsy showed mildly active chronic ileitis consistent with Crohn's, cecal biopsies also showed focal active colitis.  Transverse, descending, sigmoid, rectum biopsies all negative for inflammation.  Today, states he is doing well.  Bowel movements normal.  No melena hematochezia.  No mucus in the stools.  Overall feels great.  Osteopenia: Seen by Dr. Lela Fendt (endocrinology). Based on appropriate use of the Z score rather than T score due to his age, he has had only one value (in 2013) that would define low bone mineral density for age (Z value less than -2). His scans have been on different machines except his last two (done at [facility]). His Z scores have actually improved and higher than -2.0. advised to keep vitamin D  level normal, check at least yearly. Decrease amount of milk and other dairy due to increase in blood acidity with subsequent exit of calcium from bones to buffer this. Advised to switch to almond milk.  DEXA scan 10/16/2022 Z-score  of -1.9.  Unchanged compared to prior.    Past Medical History:  Diagnosis Date   C. difficile colitis NOV 2013   Cerebral palsy (HCC)    Crohn's disease (HCC) 2008 TCS POS SBFT NL   Rx: Prednisone then 6-MP   Osteopenia    Seizures (HCC) 1992   Vitamin D  deficiency     Past Surgical History:  Procedure Laterality Date   BIOPSY  03/23/2023   Procedure: BIOPSY;  Surgeon: Cindie Carlin POUR, DO;  Location: [facility];  Service: Endoscopy;;   COLONOSCOPY  2008   COLITIS, bX: POS GRANULOMA-->CD   COLONOSCOPY WITH PROPOFOL  N/A 03/23/2023   Procedure: COLONOSCOPY WITH PROPOFOL ;  Surgeon: Cindie Carlin POUR, DO;  Location: [facility];  Service: Endoscopy;  Laterality: N/A;  900am, asa 2    Current Outpatient Medications  Medication Sig Dispense Refill   acetaminophen (TYLENOL) 500 MG tablet Take 500 mg by mouth as needed for pain.     benzonatate  (TESSALON ) 100 MG capsule Take 1 capsule (100 mg total) by mouth 3 (three) times daily as needed for cough. 30 capsule 2   budesonide -formoterol  (SYMBICORT ) 80-4.5 MCG/ACT inhaler Inhale 2 puffs into the lungs 2 (two) times daily. FOR 7 DAYS THEN 2 PUFFS DAILY FOR 7 DAYS (Patient taking differently: Inhale 2 puffs into the lungs 2 (two) times daily. FOR 7 DAYS THEN 2 PUFFS DAILY FOR 7 DAYS as needed.) 1 Inhaler 11   calcium citrate (CALCITRATE - DOSED IN MG ELEMENTAL CALCIUM) 950 MG  tablet Take 2 tablets by mouth daily.     cetirizine  (ZYRTEC  ALLERGY) 10 MG tablet Take 1 tablet (10 mg total) by mouth daily. (Patient taking differently: Take 10 mg by mouth as needed.) 90 tablet 1   cholecalciferol  (VITAMIN D3) 25 MCG (1000 UT) tablet Take 3 tablets (3,000 Units total) by mouth daily. 90 tablet 11   folic acid  (FOLVITE ) 1 MG tablet Take 1 tablet (1 mg total) by mouth daily. 90 tablet 3   mercaptopurine  (PURINETHOL ) 50 MG tablet TAKE 1 TABLET BY MOUTH EVERY DAY ON AN EMPTY STOMACH 90 tablet 3   Midazolam  (NAYZILAM ) 5 MG/0.1ML SOLN Place into the  nose as needed.     OVER THE COUNTER MEDICATION Prevident Rx fluoride rinse once per week.     polyethylene glycol powder (GLYCOLAX /MIRALAX ) powder Take 17 g by mouth daily. (Patient taking differently: Take 17 g by mouth as needed.) 527 g 3   Probiotic CAPS 1 PO DAILY. 30 capsule 11   Sodium Fluoride (PREVIDENT 5000 BOOSTER PLUS) 1.1 % PSTE Place onto teeth at bedtime.     Spacer/Aero-Holding Chambers (AEROCHAMBER MV) inhaler Use WITH INHALER (Patient taking differently: as needed. Use WITH INHALER) 1 each 0   Wheat Dextrin (BENEFIBER) POWD 1 SCOOP PO TID (Patient taking differently: as needed. 1 SCOOP PO TID) 730 g 11   No current facility-administered medications for this visit.    Allergies as of 11/03/2023 - Review Complete 11/03/2023  Allergen Reaction Noted   Other Other (See Comments) 01/08/2013   Amoxicillin Rash 10/16/2010    Family History  Problem Relation Age of Onset   Colon cancer Neg Hx    Colon polyps Neg Hx    Crohn's disease Neg Hx     Social History   Socioeconomic History   Marital status: Single    Spouse name: Not on file   Number of children: Not on file   Years of education: Not on file   Highest education level: Not on file  Occupational History   Not on file  Tobacco Use   Smoking status: Never    Passive exposure: Never   Smokeless tobacco: Never  Vaping Use   Vaping status: Never Used  Substance and Sexual Activity   Alcohol use: No   Drug use: No   Sexual activity: Not Currently  Other Topics Concern   Not on file  Social History Narrative   WAS HOME SCHOOLED. HAS ONE SISTER.   MOM NOW DRIVES.   Social Drivers of Corporate investment banker Strain: Not on file  Food Insecurity: Not on file  Transportation Needs: Not on file  Physical Activity: Not on file  Stress: Not on file  Social Connections: Not on file    Subjective: Review of Systems  Constitutional:  Negative for chills and fever.  HENT:  Negative for congestion and  hearing loss.   Eyes:  Negative for blurred vision and double vision.  Respiratory:  Negative for cough and shortness of breath.   Cardiovascular:  Negative for chest pain and palpitations.  Gastrointestinal:  Negative for abdominal pain, blood in stool, constipation, diarrhea, heartburn, melena and vomiting.  Genitourinary:  Negative for dysuria and urgency.  Musculoskeletal:  Negative for joint pain and myalgias.  Skin:  Negative for itching and rash.  Neurological:  Negative for dizziness and headaches.  Psychiatric/Behavioral:  Negative for depression. The patient is not nervous/anxious.      Objective: BP (!) 135/90 (BP Location: Left Arm, Patient  Position: Sitting, Cuff Size: Normal)   Pulse 87   Temp 97.8 F (36.6 C) (Temporal)   Ht 5' 5 (1.651 m)   Wt 155 lb 8 oz (70.5 kg)   BMI 25.88 kg/m  Physical Exam Constitutional:      Appearance: Normal appearance.  HENT:     Head: Normocephalic and atraumatic.  Eyes:     Extraocular Movements: Extraocular movements intact.     Conjunctiva/sclera: Conjunctivae normal.  Cardiovascular:     Rate and Rhythm: Normal rate and regular rhythm.  Pulmonary:     Effort: Pulmonary effort is normal.     Breath sounds: Normal breath sounds.  Abdominal:     General: Bowel sounds are normal.     Palpations: Abdomen is soft.  Musculoskeletal:        General: Normal range of motion.     Cervical back: Normal range of motion and neck supple.  Skin:    General: Skin is warm.  Neurological:     General: No focal deficit present.     Mental Status: He is alert and oriented to person, place, and time.  Psychiatric:        Mood and Affect: Mood normal.        Behavior: Behavior normal.      Assessment: *Ileocolonic Crohn's disease-diagnosed 2008 *Vitamin D  deficiency *Thrombocytosis-chronic, improved *Iron deficiency *Osteopenia  Plan: Clinically, patient doing well from a Crohn's colitis standpoint.  No abdominal pain, diarrhea,  melena, hematochezia today.  Recent colonoscopy as per HPI.  Mild inflammation in the terminal ileum and cecum.  Patient has been on 6-MP since 2008.  Discussed in depth with patient and mother today long-term risk of 6-MP including possibility of T-cell lymphoma.  Discussed switching treatment to a different biologic such as Entyvio.  Patient is adamant that he would not be able to tolerate any kind of infusion or injection medication.  This limits our options as the only oral biologic currently available would be Rinvoq which would require failure of anti-TNF prior to being covered.  After further discussion about risk of ongoing 6-MP use, they would like to stay on this medication.  They understand the risks of long-term use.  Continue on 6-MP.    We have recommended yearly skin checks as well.  Patient did research into this and is absolutely against seeing a dermatologist due to fear of possible biopsy or lesion removal.  Again counseled them on the risks of skin cancer related to 6-MP and they understand and are agreeable.  Follow-up with GI in 6 months or sooner if needed. Will check blood work at that time.   11/03/2023 2:39 PM   Disclaimer: This note was dictated with voice recognition software. Similar sounding words can inadvertently be transcribed and may not be corrected upon review.

## 2023-11-03 NOTE — Patient Instructions (Signed)
  I am happy to hear that you are doing well.  Continue on 6-MP, folic acid , I have refilled both of these today.   Also refilled your Tessalon  Perles to have as needed for cough.   Follow-up in 6 months, we will plan on repeat blood work at that time.   It is always a pleasure seeing both of you.   Dr. Cindie
